# Patient Record
Sex: Male | Born: 1965 | Marital: Married | Smoking: Never smoker
Health system: Southern US, Community
[De-identification: ages and names within clinical notes are randomized; demographics above are authoritative.]

## PROBLEM LIST (undated history)

## (undated) DIAGNOSIS — B019 Varicella without complication: Secondary | ICD-10-CM

## (undated) DIAGNOSIS — I10 Essential (primary) hypertension: Secondary | ICD-10-CM

## (undated) DIAGNOSIS — E785 Hyperlipidemia, unspecified: Secondary | ICD-10-CM

## (undated) DIAGNOSIS — J45909 Unspecified asthma, uncomplicated: Secondary | ICD-10-CM

## (undated) HISTORY — DX: Unspecified asthma, uncomplicated: J45.909

## (undated) HISTORY — DX: Essential (primary) hypertension: I10

## (undated) HISTORY — PX: NO PAST SURGERIES: SHX2092

## (undated) HISTORY — DX: Varicella without complication: B01.9

## (undated) HISTORY — DX: Hyperlipidemia, unspecified: E78.5

---

## 2016-06-15 ENCOUNTER — Encounter (INDEPENDENT_AMBULATORY_CARE_PROVIDER_SITE_OTHER): Payer: Self-pay

## 2016-06-15 ENCOUNTER — Ambulatory Visit (INDEPENDENT_AMBULATORY_CARE_PROVIDER_SITE_OTHER): Payer: 59 | Admitting: Family Medicine

## 2016-06-15 ENCOUNTER — Encounter: Payer: Self-pay | Admitting: Family Medicine

## 2016-06-15 DIAGNOSIS — I10 Essential (primary) hypertension: Secondary | ICD-10-CM

## 2016-06-15 NOTE — Assessment & Plan Note (Signed)
New problem, new diagnosis. Discussed lifestyle/dietary changes/weight loss vs. starting medication. Patient elected for a trial of dietary lifestyle changes. We'll follow-up in 4-6 weeks.

## 2016-06-15 NOTE — Progress Notes (Signed)
   Subjective:  Patient ID: Terrence Yang, male    DOB: [birth year]  Age: 50 y.o. MRN: [id number]  CC: Elevated BP  HPI Terrence Yang is a 50 y.o. male presents to the clinic today as a new patient with the above complaint.  Elevated BP  Patient recently had a physical at work.  His blood pressure was found to be elevated in the 140s over 90s. It was repeated and remained elevated.  His blood pressure readings has slowly been increasing over the past few years.  His weight has also increased.  He is not currently on any medication.  He was instructed to follow-up/establish a primary.  No associated symptoms regarding his elevated blood pressure - no chest pain, shortness of breath. Of note, he has had recent changes in his vision but this has resolved.  Patient endorses regular exercise.  No known exacerbating or relieving factors.  No other complaints at this time.  PMH, Surgical Hx, Family Hx, Social History reviewed and updated as below.  Past Medical History:  Diagnosis Date  . Asthma   . Chicken pox   . Hyperlipidemia   . Hypertension    Past Surgical History:  Procedure Laterality Date  . NO PAST SURGERIES     Family History  Problem Relation Age of Onset  . Sudden death Brother   . Heart disease Brother   . Hypertension Brother   . Heart disease Maternal Grandmother    Social History  Substance Use Topics  . Smoking status: Never Smoker  . Smokeless tobacco: Never Used  . Alcohol use Yes   Review of Systems  Cardiovascular:       Elevated BP.  All other systems reviewed and are negative.  Objective:   Today's Vitals: BP (!) 136/92 (BP Location: Left Arm, Patient Position: Sitting, Cuff Size: Large)   Pulse (!) 59   Temp 98.3 F (36.8 C) (Oral)   Ht 5\' 10"  (1.778 m) Comment: with shoes  Wt 221 lb 6 oz (100.4 kg)   SpO2 99%   BMI 31.76 kg/m   Physical Exam  Constitutional: He is oriented to person, place, and time. He appears well-developed  and well-nourished. No distress.  HENT:  Head: Normocephalic and atraumatic.  Nose: Nose normal.  Mouth/Throat: Oropharynx is clear and moist. No oropharyngeal exudate.  Normal TM's bilaterally.   Eyes: Conjunctivae are normal. No scleral icterus.  Neck: Neck supple. No thyromegaly present.  Cardiovascular: Normal rate and regular rhythm.   No murmur heard. Pulmonary/Chest: Effort normal and breath sounds normal. He has no wheezes. He has no rales.  Abdominal: Soft. He exhibits no distension. There is no tenderness. There is no rebound and no guarding.  Musculoskeletal: Normal range of motion. He exhibits no edema.  Lymphadenopathy:    He has no cervical adenopathy.  Neurological: He is alert and oriented to person, place, and time.  Skin: Skin is warm and dry. No rash noted.  Psychiatric: He has a normal mood and affect.  Vitals reviewed.  Assessment & Plan:   Problem List Items Addressed This Visit    Essential hypertension    New problem, new diagnosis. Discussed lifestyle/dietary changes/weight loss vs. starting medication. Patient elected for a trial of dietary lifestyle changes. We'll follow-up in 4-6 weeks.       Other Visit Diagnoses   None.    Follow-up: Return in about 5 weeks (around 07/20/2016).  Everlene OtherJayce Emalia Witkop DO [facility] [place]

## 2016-06-15 NOTE — Patient Instructions (Signed)
Watch your diet and try and exercise.  I'll see you back in 4-6 weeks.  Take care  Dr. Adriana Simasook   Health Maintenance, Male A healthy lifestyle and preventative care can promote health and wellness.  Maintain regular health, dental, and eye exams.  Eat a healthy diet. Foods like vegetables, fruits, whole grains, low-fat dairy products, and lean protein foods contain the nutrients you need and are low in calories. Decrease your intake of foods high in solid fats, added sugars, and salt. Get information about a proper diet from your health care provider, if necessary.  Regular physical exercise is one of the most important things you can do for your health. Most adults should get at least 150 minutes of moderate-intensity exercise (any activity that increases your heart rate and causes you to sweat) each week. In addition, most adults need muscle-strengthening exercises on 2 or more days a week.   Maintain a healthy weight. The body mass index (BMI) is a screening tool to identify possible weight problems. It provides an estimate of body fat based on height and weight. Your health care provider can find your BMI and can help you achieve or maintain a healthy weight. For males 20 years and older:  A BMI below 18.5 is considered underweight.  A BMI of 18.5 to 24.9 is normal.  A BMI of 25 to 29.9 is considered overweight.  A BMI of 30 and above is considered obese.  Maintain normal blood lipids and cholesterol by exercising and minimizing your intake of saturated fat. Eat a balanced diet with plenty of fruits and vegetables. Blood tests for lipids and cholesterol should begin at age 48 and be repeated every 5 years. If your lipid or cholesterol levels are high, you are over age 48, or you are at high risk for heart disease, you may need your cholesterol levels checked more frequently.Ongoing high lipid and cholesterol levels should be treated with medicines if diet and exercise are not  working.  If you smoke, find out from your health care provider how to quit. If you do not use tobacco, do not start.  Lung cancer screening is recommended for adults aged 55-80 years who are at high risk for developing lung cancer because of a history of smoking. A yearly low-dose CT scan of the lungs is recommended for people who have at least a 30-pack-year history of smoking and are current smokers or have quit within the past 15 years. A pack year of smoking is smoking an average of 1 pack of cigarettes a day for 1 year (for example, a 30-pack-year history of smoking could mean smoking 1 pack a day for 30 years or 2 packs a day for 15 years). Yearly screening should continue until the smoker has stopped smoking for at least 15 years. Yearly screening should be stopped for people who develop a health problem that would prevent them from having lung cancer treatment.  If you choose to drink alcohol, do not have more than 2 drinks per day. One drink is considered to be 12 oz (360 mL) of beer, 5 oz (150 mL) of wine, or 1.5 oz (45 mL) of liquor.  Avoid the use of street drugs. Do not share needles with anyone. Ask for help if you need support or instructions about stopping the use of drugs.  High blood pressure causes heart disease and increases the risk of stroke. High blood pressure is more likely to develop in:  People who have blood pressure in  the end of the normal range (100-139/85-89 mm Hg).  People who are overweight or obese.  People who are African American.  If you are 61-92 years of age, have your blood pressure checked every 3-5 years. If you are 102 years of age or older, have your blood pressure checked every year. You should have your blood pressure measured twice--once when you are at a hospital or clinic, and once when you are not at a hospital or clinic. Record the average of the two measurements. To check your blood pressure when you are not at a hospital or clinic, you can  use:  An automated blood pressure machine at a pharmacy.  A home blood pressure monitor.  If you are 81-32 years old, ask your health care provider if you should take aspirin to prevent heart disease.  Diabetes screening involves taking a blood sample to check your fasting blood sugar level. This should be done once every 3 years after age 66 if you are at a normal weight and without risk factors for diabetes. Testing should be considered at a younger age or be carried out more frequently if you are overweight and have at least 1 risk factor for diabetes.  Colorectal cancer can be detected and often prevented. Most routine colorectal cancer screening begins at the age of 17 and continues through age 51. However, your health care provider may recommend screening at an earlier age if you have risk factors for colon cancer. On a yearly basis, your health care provider may provide home test kits to check for hidden blood in the stool. A small camera at the end of a tube may be used to directly examine the colon (sigmoidoscopy or colonoscopy) to detect the earliest forms of colorectal cancer. Talk to your health care provider about this at age 61 when routine screening begins. A direct exam of the colon should be repeated every 5-10 years through age 43, unless early forms of precancerous polyps or small growths are found.  People who are at an increased risk for hepatitis B should be screened for this virus. You are considered at high risk for hepatitis B if:  You were born in a country where hepatitis B occurs often. Talk with your health care provider about which countries are considered high risk.  Your parents were born in a high-risk country and you have not received a shot to protect against hepatitis B (hepatitis B vaccine).  You have HIV or AIDS.  You use needles to inject street drugs.  You live with, or have sex with, someone who has hepatitis B.  You are a man who has sex with other  men (MSM).  You get hemodialysis treatment.  You take certain medicines for conditions like cancer, organ transplantation, and autoimmune conditions.  Hepatitis C blood testing is recommended for all people born from [birth year] through 1963 and any individual with known risk factors for hepatitis C.  Healthy men should no longer receive prostate-specific antigen (PSA) blood tests as part of routine cancer screening. Talk to your health care provider about prostate cancer screening.  Testicular cancer screening is not recommended for adolescents or adult males who have no symptoms. Screening includes self-exam, a health care provider exam, and other screening tests. Consult with your health care provider about any symptoms you have or any concerns you have about testicular cancer.  Practice safe sex. Use condoms and avoid high-risk sexual practices to reduce the spread of sexually transmitted infections (STIs).  You  should be screened for STIs, including gonorrhea and chlamydia if:  You are sexually active and are younger than 24 years.  You are older than 24 years, and your health care provider tells you that you are at risk for this type of infection.  Your sexual activity has changed since you were last screened, and you are at an increased risk for chlamydia or gonorrhea. Ask your health care provider if you are at risk.  If you are at risk of being infected with HIV, it is recommended that you take a prescription medicine daily to prevent HIV infection. This is called pre-exposure prophylaxis (PrEP). You are considered at risk if:  You are a man who has sex with other men (MSM).  You are a heterosexual man who is sexually active with multiple partners.  You take drugs by injection.  You are sexually active with a partner who has HIV.  Talk with your health care provider about whether you are at high risk of being infected with HIV. If you choose to begin PrEP, you should first be tested  for HIV. You should then be tested every 3 months for as long as you are taking PrEP.  Use sunscreen. Apply sunscreen liberally and repeatedly throughout the day. You should seek shade when your shadow is shorter than you. Protect yourself by wearing long sleeves, pants, a wide-brimmed hat, and sunglasses year round whenever you are outdoors.  Tell your health care provider of new moles or changes in moles, especially if there is a change in shape or color. Also, tell your health care provider if a mole is larger than the size of a pencil eraser.  A one-time screening for abdominal aortic aneurysm (AAA) and surgical repair of large AAAs by ultrasound is recommended for men aged 49-75 years who are current or former smokers.  Stay current with your vaccines (immunizations).   This information is not intended to replace advice given to you by your health care provider. Make sure you discuss any questions you have with your health care provider.   Document Released: 04/21/2008 Document Revised: 11/14/2014 Document Reviewed: 03/21/2011 Elsevier Interactive Patient Education [employer].

## 2016-06-15 NOTE — Progress Notes (Signed)
Pre visit review using our clinic review tool, if applicable. No additional management support is needed unless otherwise documented below in the visit note. 

## 2016-07-22 ENCOUNTER — Ambulatory Visit: Payer: 59 | Admitting: Family Medicine

## 2021-01-21 ENCOUNTER — Encounter: Payer: Self-pay | Admitting: Family Medicine

## 2021-01-21 ENCOUNTER — Ambulatory Visit (INDEPENDENT_AMBULATORY_CARE_PROVIDER_SITE_OTHER): Payer: 59 | Admitting: Family Medicine

## 2021-01-21 ENCOUNTER — Other Ambulatory Visit: Payer: Self-pay

## 2021-01-21 VITALS — BP 157/90 | HR 59 | Ht 70.0 in | Wt 227.4 lb

## 2021-01-21 DIAGNOSIS — I10 Essential (primary) hypertension: Secondary | ICD-10-CM

## 2021-01-21 DIAGNOSIS — Z7689 Persons encountering health services in other specified circumstances: Secondary | ICD-10-CM | POA: Insufficient documentation

## 2021-01-21 MED ORDER — LISINOPRIL 10 MG PO TABS: 10.0000 mg | ORAL_TABLET | Freq: Every day | ORAL | 3 refills | Status: DC

## 2021-01-21 NOTE — Assessment & Plan Note (Signed)
Patient has had multiple episodes of htn in the past but has always tried to control with diet but he is ready to try medication. Manual BP confirms 158/96.   Plan- Start Lisinopril, f/u 1 month PE and labs.

## 2021-01-21 NOTE — Assessment & Plan Note (Signed)
Patient in to establish care, he has not had a pcp in a while. His only active complaint is his BP. No CP or SOB.

## 2021-01-21 NOTE — Progress Notes (Signed)
Established Patient Office Visit  SUBJECTIVE:  Subjective  Patient ID: Terrence Yang, male    DOB: 11/14/1965  Age: 55 y.o. MRN: [id number]  CC:  Chief Complaint  Patient presents with  . New Patient (Initial Visit)    Patient has complaints of low heart rate and frequent headaches    HPI Terrence Yang is a 55 y.o. male presenting today for     Past Medical History:  Diagnosis Date  . Asthma   . Chicken pox   . Hyperlipidemia   . Hypertension     Past Surgical History:  Procedure Laterality Date  . NO PAST SURGERIES      Family History  Problem Relation Age of Onset  . Sudden death Brother   . Heart disease Brother   . Hypertension Brother   . Heart disease Maternal Grandmother     Social History   Socioeconomic History  . Marital status: Married    Spouse name: Not on file  . Number of children: Not on file  . Years of education: Not on file  . Highest education level: Not on file  Occupational History  . Not on file  Tobacco Use  . Smoking status: Never Smoker  . Smokeless tobacco: Never Used  Substance and Sexual Activity  . Alcohol use: Yes  . Drug use: No  . Sexual activity: Yes  Other Topics Concern  . Not on file  Social History Narrative  . Not on file   Social Determinants of Health   Financial Resource Strain: Not on file  Food Insecurity: Not on file  Transportation Needs: Not on file  Physical Activity: Not on file  Stress: Not on file  Social Connections: Not on file  Intimate Partner Violence: Not on file     Current Outpatient Medications:  .  lisinopril (ZESTRIL) 10 MG tablet, Take 1 tablet (10 mg total) by mouth daily., Disp: 90 tablet, Rfl: 3   No Known Allergies  ROS Review of Systems  Constitutional: Negative.   HENT: Negative.   Respiratory: Negative.   Cardiovascular: Negative.   Genitourinary: Negative.   Musculoskeletal: Negative.   Psychiatric/Behavioral: Negative.      OBJECTIVE:    Physical  Exam Vitals reviewed.  Constitutional:      Appearance: Normal appearance.  HENT:     Right Ear: Tympanic membrane normal.     Mouth/Throat:     Mouth: Mucous membranes are dry.  Cardiovascular:     Rate and Rhythm: Normal rate and regular rhythm.  Pulmonary:     Effort: Pulmonary effort is normal.  Musculoskeletal:        General: Normal range of motion.  Skin:    General: Skin is warm.  Neurological:     Mental Status: He is alert.  Psychiatric:        Mood and Affect: Mood normal.     BP (!) 157/90   Pulse (!) 59   Ht 5' 10"  (1.778 m)   Wt 227 lb 6.4 oz (103.1 kg)   BMI 32.63 kg/m  Wt Readings from Last 3 Encounters:  01/21/21 227 lb 6.4 oz (103.1 kg)  06/15/16 221 lb 6 oz (100.4 kg)    Health Maintenance Due  Topic Date Due  . Hepatitis C Screening  Never done  . COVID-19 Vaccine (1) Never done  . HIV Screening  Never done  . COLONOSCOPY (Pts 45-36yr Insurance coverage will need to be confirmed)  Never done  . INFLUENZA VACCINE  Never done  . TETANUS/TDAP  12/06/2020    There are no preventive care reminders to display for this patient.  No flowsheet data found. No flowsheet data found.  No results found for: TSH No results found for: ALBUMIN, ANIONGAP, EGFR, GFR No results found for: CHOL, HDL, LDLCALC, CHOLHDL No results found for: TRIG No results found for: HGBA1C    ASSESSMENT & PLAN:   Problem List Items Addressed This Visit      Cardiovascular and Mediastinum   Essential hypertension - Primary    Patient has had multiple episodes of htn in the past but has always tried to control with diet but he is ready to try medication. Manual BP confirms 158/96.   Plan- Start Lisinopril, f/u 1 month PE and labs.       Relevant Medications   lisinopril (ZESTRIL) 10 MG tablet   Other Relevant Orders   EKG 12-Lead     Other   Encounter to establish care    Patient in to establish care, he has not had a pcp in a while. His only active complaint is  his BP. No CP or SOB.       Relevant Orders   CBC with Differential/Platelet   COMPLETE METABOLIC PANEL WITH GFR   PSA   TSH   Hemoglobin A1c      Meds ordered this encounter  Medications  . lisinopril (ZESTRIL) 10 MG tablet    Sig: Take 1 tablet (10 mg total) by mouth daily.    Dispense:  90 tablet    Refill:  3      Follow-up: No follow-ups on file.    Beckie Salts, [place] [address] [place], [place] [address]

## 2021-01-22 LAB — CBC WITH DIFFERENTIAL/PLATELET
Absolute Monocytes: 602 cells/uL (ref 200–950)
Basophils Absolute: 89 cells/uL (ref 0–200)
Basophils Relative: 1.5 %
Eosinophils Absolute: 118 cells/uL (ref 15–500)
Eosinophils Relative: 2 %
HCT: 53.7 % — ABNORMAL HIGH (ref 38.5–50.0)
Hemoglobin: 17.8 g/dL — ABNORMAL HIGH (ref 13.2–17.1)
Lymphs Abs: 2000 cells/uL (ref 850–3900)
MCH: 28.6 pg (ref 27.0–33.0)
MCHC: 33.1 g/dL (ref 32.0–36.0)
MCV: 86.3 fL (ref 80.0–100.0)
MPV: 10.3 fL (ref 7.5–12.5)
Monocytes Relative: 10.2 %
Neutro Abs: 3092 cells/uL (ref 1500–7800)
Neutrophils Relative %: 52.4 %
Platelets: 250 10*3/uL (ref 140–400)
RBC: 6.22 10*6/uL — ABNORMAL HIGH (ref 4.20–5.80)
RDW: 12.7 % (ref 11.0–15.0)
Total Lymphocyte: 33.9 %
WBC: 5.9 10*3/uL (ref 3.8–10.8)

## 2021-01-22 LAB — COMPLETE METABOLIC PANEL WITH GFR
AG Ratio: 1.8 (calc) (ref 1.0–2.5)
ALT: 47 U/L — ABNORMAL HIGH (ref 9–46)
AST: 27 U/L (ref 10–35)
Albumin: 4.8 g/dL (ref 3.6–5.1)
Alkaline phosphatase (APISO): 65 U/L (ref 35–144)
BUN: 16 mg/dL (ref 7–25)
CO2: 26 mmol/L (ref 20–32)
Calcium: 9.9 mg/dL (ref 8.6–10.3)
Chloride: 102 mmol/L (ref 98–110)
Creat: 0.99 mg/dL (ref 0.70–1.33)
GFR, Est African American: 101 mL/min/{1.73_m2} (ref >=60)
GFR, Est Non African American: 87 mL/min/{1.73_m2} (ref >=60)
Globulin: 2.7 g/dL (calc) (ref 1.9–3.7)
Glucose, Bld: 88 mg/dL (ref 65–99)
Potassium: 4.5 mmol/L (ref 3.5–5.3)
Sodium: 138 mmol/L (ref 135–146)
Total Bilirubin: 2.2 mg/dL — ABNORMAL HIGH (ref 0.2–1.2)
Total Protein: 7.5 g/dL (ref 6.1–8.1)

## 2021-01-22 LAB — HEMOGLOBIN A1C
Hgb A1c MFr Bld: 4.9 % of total Hgb (ref <5.7)
Mean Plasma Glucose: 94 mg/dL
eAG (mmol/L): 5.2 mmol/L

## 2021-01-22 LAB — PSA: PSA: 0.39 ng/mL (ref <=4.0)

## 2021-01-22 LAB — TSH: TSH: 0.7 mIU/L (ref 0.40–4.50)

## 2021-02-25 ENCOUNTER — Ambulatory Visit (INDEPENDENT_AMBULATORY_CARE_PROVIDER_SITE_OTHER): Payer: 59 | Admitting: Internal Medicine

## 2021-02-25 ENCOUNTER — Encounter: Payer: Self-pay | Admitting: Internal Medicine

## 2021-02-25 ENCOUNTER — Other Ambulatory Visit: Payer: Self-pay

## 2021-02-25 VITALS — BP 146/72 | HR 82 | Ht 70.0 in | Wt 215.9 lb

## 2021-02-25 DIAGNOSIS — I10 Essential (primary) hypertension: Secondary | ICD-10-CM | POA: Diagnosis not present

## 2021-02-25 DIAGNOSIS — Z0000 Encounter for general adult medical examination without abnormal findings: Secondary | ICD-10-CM | POA: Diagnosis not present

## 2021-02-25 NOTE — Assessment & Plan Note (Signed)

## 2021-02-25 NOTE — Progress Notes (Signed)
Established Patient Office Visit  Subjective:  Patient ID: Terrence Yang, male    DOB: 20-Jan-1966  Age: 55 y.o. MRN: [id number]  CC:  Chief Complaint  Patient presents with  . Annual Exam    HPI  Terrence Yang presents for  Physical, patient denies any chest pain shortness of breath.  His blood tests were reviewed they were found to be normal except for elevated hemoglobin.  Patient was advised to Give blood twice a year.  He does not have any prostate trouble there is no history of diabetes 0r asthma he is taking blood pressure medication.  Past Medical History:  Diagnosis Date  . Asthma   . Chicken pox   . Hyperlipidemia   . Hypertension     Past Surgical History:  Procedure Laterality Date  . NO PAST SURGERIES      Family History  Problem Relation Age of Onset  . Sudden death Brother   . Heart disease Brother   . Hypertension Brother   . Heart disease Maternal Grandmother     Social History   Socioeconomic History  . Marital status: Married    Spouse name: Not on file  . Number of children: Not on file  . Years of education: Not on file  . Highest education level: Not on file  Occupational History  . Not on file  Tobacco Use  . Smoking status: Never Smoker  . Smokeless tobacco: Never Used  Substance and Sexual Activity  . Alcohol use: Yes  . Drug use: No  . Sexual activity: Yes  Other Topics Concern  . Not on file  Social History Narrative  . Not on file   Social Determinants of Health   Financial Resource Strain: Not on file  Food Insecurity: Not on file  Transportation Needs: Not on file  Physical Activity: Not on file  Stress: Not on file  Social Connections: Not on file  Intimate Partner Violence: Not on file     Current Outpatient Medications:  .  lisinopril (ZESTRIL) 10 MG tablet, Take 1 tablet (10 mg total) by mouth daily., Disp: 90 tablet, Rfl: 3   No Known Allergies  ROS Review of Systems  Constitutional: Negative.   HENT:  Negative.   Eyes: Negative.   Respiratory: Negative.   Cardiovascular: Negative.   Gastrointestinal: Negative.   Endocrine: Negative.   Genitourinary: Negative.   Musculoskeletal: Negative.   Skin: Negative.   Allergic/Immunologic: Negative.   Neurological: Negative.   Hematological: Negative.   Psychiatric/Behavioral: Negative.   All other systems reviewed and are negative.     Objective:    Physical Exam Vitals reviewed.  Constitutional:      Appearance: Normal appearance.  HENT:     Mouth/Throat:     Mouth: Mucous membranes are moist.  Eyes:     Pupils: Pupils are equal, round, and reactive to light.  Neck:     Vascular: No carotid bruit.  Cardiovascular:     Rate and Rhythm: Normal rate and regular rhythm.     Pulses: Normal pulses.     Heart sounds: Normal heart sounds.  Pulmonary:     Effort: Pulmonary effort is normal.     Breath sounds: Normal breath sounds.  Abdominal:     General: Bowel sounds are normal.     Palpations: Abdomen is soft. There is no hepatomegaly, splenomegaly or mass.     Tenderness: There is no abdominal tenderness.     Hernia: No hernia is present.  Genitourinary:    Comments: Rectal examination revealed normal sphincter tone.  Prostate normal size.  Male genitalia normal.  Stool negative for blood. Musculoskeletal:     Cervical back: Neck supple.     Right lower leg: No edema.     Left lower leg: No edema.  Skin:    Findings: No rash.  Neurological:     Mental Status: He is alert and oriented to person, place, and time.     Motor: No weakness.  Psychiatric:        Mood and Affect: Mood normal.        Behavior: Behavior normal.     BP (!) 146/72   Pulse 82   Ht 5\' 10"  (1.778 m)   Wt 215 lb 14.4 oz (97.9 kg)   BMI 30.98 kg/m  Wt Readings from Last 3 Encounters:  02/25/21 215 lb 14.4 oz (97.9 kg)  01/21/21 227 lb 6.4 oz (103.1 kg)  06/15/16 221 lb 6 oz (100.4 kg)     Health Maintenance Due  Topic Date Due  .  Hepatitis C Screening  Never done  . COVID-19 Vaccine (1) Never done  . HIV Screening  Never done  . COLONOSCOPY (Pts 45-52yrs Insurance coverage will need to be confirmed)  Never done  . TETANUS/TDAP  12/06/2020    There are no preventive care reminders to display for this patient.  Lab Results  Component Value Date   TSH 0.70 01/21/2021   Lab Results  Component Value Date   WBC 5.9 01/21/2021   HGB 17.8 (H) 01/21/2021   HCT 53.7 (H) 01/21/2021   MCV 86.3 01/21/2021   PLT 250 01/21/2021   Lab Results  Component Value Date   NA 138 01/21/2021   K 4.5 01/21/2021   CO2 26 01/21/2021   GLUCOSE 88 01/21/2021   BUN 16 01/21/2021   CREATININE 0.99 01/21/2021   BILITOT 2.2 (H) 01/21/2021   AST 27 01/21/2021   ALT 47 (H) 01/21/2021   PROT 7.5 01/21/2021   CALCIUM 9.9 01/21/2021   No results found for: CHOL No results found for: HDL No results found for: LDLCALC No results found for: TRIG No results found for: CHOLHDL Lab Results  Component Value Date   HGBA1C 4.9 01/21/2021      Assessment & Plan:   Problem List Items Addressed This Visit   None     No orders of the defined types were placed in this encounter.   Follow-up: No follow-ups on file.    01/23/2021, MD

## 2021-04-21 ENCOUNTER — Encounter: Payer: Self-pay | Admitting: Family Medicine

## 2021-04-23 ENCOUNTER — Ambulatory Visit (INDEPENDENT_AMBULATORY_CARE_PROVIDER_SITE_OTHER): Payer: 59 | Admitting: Family Medicine

## 2021-04-23 ENCOUNTER — Encounter: Payer: Self-pay | Admitting: Family Medicine

## 2021-04-23 ENCOUNTER — Other Ambulatory Visit: Payer: Self-pay

## 2021-04-23 VITALS — BP 131/76 | HR 58 | Ht 70.0 in | Wt 206.7 lb

## 2021-04-23 DIAGNOSIS — R001 Bradycardia, unspecified: Secondary | ICD-10-CM

## 2021-04-23 NOTE — Assessment & Plan Note (Signed)
Patient in today with Bradycardia during sleep mostly. The HR has been down to 35 in sleep and was captured with his apple watch. No symptoms when he wakes from the watch alarm. Denies SOB or CP. His Grandfather did have a pacemaker for low HR, ECG shows 60 today.   Plan- Patient will return and have 24 hour.

## 2021-04-23 NOTE — Progress Notes (Signed)
Established Patient Office Visit  SUBJECTIVE:  Subjective  Patient ID: Terrence Yang, male    DOB: 02/10/1966  Age: 55 y.o. MRN: [id number]  CC:  Chief Complaint  Patient presents with   Bradycardia    Patient complains of bradycardia at night. Happening once every hour from 1am to 6:30 am. Patient states this has been reoccurring since March.    HPI Terrence Yang is a 55 y.o. male presenting today for     Past Medical History:  Diagnosis Date   Asthma    Chicken pox    Hyperlipidemia    Hypertension     Past Surgical History:  Procedure Laterality Date   NO PAST SURGERIES      Family History  Problem Relation Age of Onset   Sudden death Brother    Heart disease Brother    Hypertension Brother    Heart disease Maternal Grandmother     Social History   Socioeconomic History   Marital status: Married    Spouse name: Not on file   Number of children: Not on file   Years of education: Not on file   Highest education level: Not on file  Occupational History   Not on file  Tobacco Use   Smoking status: Never   Smokeless tobacco: Never  Substance and Sexual Activity   Alcohol use: Yes   Drug use: No   Sexual activity: Yes  Other Topics Concern   Not on file  Social History Narrative   Not on file   Social Determinants of Health   Financial Resource Strain: Not on file  Food Insecurity: Not on file  Transportation Needs: Not on file  Physical Activity: Not on file  Stress: Not on file  Social Connections: Not on file  Intimate Partner Violence: Not on file     Current Outpatient Medications:    lisinopril (ZESTRIL) 10 MG tablet, Take 1 tablet (10 mg total) by mouth daily., Disp: 90 tablet, Rfl: 3   No Known Allergies  ROS Review of Systems  Constitutional: Negative.   HENT: Negative.    Respiratory: Negative.    Cardiovascular: Negative.   Gastrointestinal: Negative.   Neurological: Negative.   Psychiatric/Behavioral: Negative.       OBJECTIVE:    Physical Exam HENT:     Head: Normocephalic.  Cardiovascular:     Rate and Rhythm: Normal rate and regular rhythm.  Chest:  Breasts:    Right: Normal.     Left: Normal.  Musculoskeletal:        General: Normal range of motion.  Skin:    General: Skin is warm.  Neurological:     Mental Status: He is alert.    BP 131/76   Pulse (!) 58   Ht 5' 10"  (1.778 m)   Wt 206 lb 11.2 oz (93.8 kg)   BMI 29.66 kg/m  Wt Readings from Last 3 Encounters:  04/23/21 206 lb 11.2 oz (93.8 kg)  02/25/21 215 lb 14.4 oz (97.9 kg)  01/21/21 227 lb 6.4 oz (103.1 kg)    Health Maintenance Due  Topic Date Due   HIV Screening  Never done   Hepatitis C Screening  Never done   COLONOSCOPY (Pts 45-73yr Insurance coverage will need to be confirmed)  Never done   Zoster Vaccines- Shingrix (1 of 2) Never done   TETANUS/TDAP  12/06/2020    There are no preventive care reminders to display for this patient.  CBC Latest Ref Rng & Units  01/21/2021  WBC 3.8 - 10.8 Thousand/uL 5.9  Hemoglobin 13.2 - 17.1 g/dL 17.8(H)  Hematocrit 38.5 - 50.0 % 53.7(H)  Platelets 140 - 400 Thousand/uL 250   CMP Latest Ref Rng & Units 01/21/2021  Glucose 65 - 99 mg/dL 88  BUN 7 - 25 mg/dL 16  Creatinine 0.70 - 1.33 mg/dL 0.99  Sodium 135 - 146 mmol/L 138  Potassium 3.5 - 5.3 mmol/L 4.5  Chloride 98 - 110 mmol/L 102  CO2 20 - 32 mmol/L 26  Calcium 8.6 - 10.3 mg/dL 9.9  Total Protein 6.1 - 8.1 g/dL 7.5  Total Bilirubin 0.2 - 1.2 mg/dL 2.2(H)  AST 10 - 35 U/L 27  ALT 9 - 46 U/L 47(H)    Lab Results  Component Value Date   TSH 0.70 01/21/2021   No results found for: ALBUMIN, ANIONGAP, EGFR, GFR No results found for: CHOL, HDL, LDLCALC, CHOLHDL No results found for: TRIG Lab Results  Component Value Date   HGBA1C 4.9 01/21/2021      ASSESSMENT & PLAN:   Problem List Items Addressed This Visit       Other   Bradycardia - Primary    Patient in today with Bradycardia during sleep mostly.  The HR has been down to 35 in sleep and was captured with his apple watch. No symptoms when he wakes from the watch alarm. Denies SOB or CP. His Grandfather did have a pacemaker for low HR, ECG shows 60 today.   Plan- Patient will return and have 24 hour.        Relevant Orders   EKG 12-Lead    No orders of the defined types were placed in this encounter.     Follow-up: No follow-ups on file.    Beckie Salts, [employer] [address] [place],  [address]

## 2021-04-27 ENCOUNTER — Ambulatory Visit: Payer: 59

## 2021-04-27 ENCOUNTER — Other Ambulatory Visit: Payer: Self-pay

## 2021-05-04 ENCOUNTER — Encounter: Payer: Self-pay | Admitting: Internal Medicine

## 2021-05-04 ENCOUNTER — Ambulatory Visit (INDEPENDENT_AMBULATORY_CARE_PROVIDER_SITE_OTHER): Payer: 59 | Admitting: Internal Medicine

## 2021-05-04 ENCOUNTER — Other Ambulatory Visit: Payer: Self-pay

## 2021-05-04 VITALS — BP 129/73 | HR 54 | Ht 70.0 in | Wt 206.8 lb

## 2021-05-04 DIAGNOSIS — I10 Essential (primary) hypertension: Secondary | ICD-10-CM | POA: Diagnosis not present

## 2021-05-04 DIAGNOSIS — R001 Bradycardia, unspecified: Secondary | ICD-10-CM | POA: Diagnosis not present

## 2021-05-04 NOTE — Progress Notes (Signed)
Established Patient Office Visit  Subjective:  Patient ID: Terrence Yang, male    DOB: 1966-03-28  Age: 55 y.o. MRN: [id number]  CC:  Chief Complaint  Patient presents with   Holter Results    HPI  Terrence Yang presents for evaluation of bradycardia.  Patient denies any chest pain there is no history of dizziness or passing out spell.  No history of seizure activity.  No history of heart attack.  Holter monitor also revealed episode of bradycardia with a rate dropping to 40, he has a smart watch for which she calculated oximetry as well as heart rate.  There is no episode of vasovagal episode or seizure disorder.  He does not smoke does not drink.  Past Medical History:  Diagnosis Date   Asthma    Chicken pox    Hyperlipidemia    Hypertension     Past Surgical History:  Procedure Laterality Date   NO PAST SURGERIES      Family History  Problem Relation Age of Onset   Sudden death Brother    Heart disease Brother    Hypertension Brother    Heart disease Maternal Grandmother     Social History   Socioeconomic History   Marital status: Married    Spouse name: Not on file   Number of children: Not on file   Years of education: Not on file   Highest education level: Not on file  Occupational History   Not on file  Tobacco Use   Smoking status: Never   Smokeless tobacco: Never  Substance and Sexual Activity   Alcohol use: Yes   Drug use: No   Sexual activity: Yes  Other Topics Concern   Not on file  Social History Narrative   Not on file   Social Determinants of Health   Financial Resource Strain: Not on file  Food Insecurity: Not on file  Transportation Needs: Not on file  Physical Activity: Not on file  Stress: Not on file  Social Connections: Not on file  Intimate Partner Violence: Not on file     Current Outpatient Medications:    lisinopril (ZESTRIL) 10 MG tablet, Take 1 tablet (10 mg total) by mouth daily., Disp: 90 tablet, Rfl: 3   No Known  Allergies  ROS Review of Systems  Constitutional: Negative.   HENT: Negative.    Eyes: Negative.   Respiratory: Negative.    Cardiovascular: Negative.   Gastrointestinal: Negative.   Endocrine: Negative.   Genitourinary: Negative.   Musculoskeletal: Negative.   Skin: Negative.   Allergic/Immunologic: Negative.   Neurological: Negative.   Hematological: Negative.   Psychiatric/Behavioral: Negative.    All other systems reviewed and are negative.    Objective:    Physical Exam Vitals reviewed.  Constitutional:      Appearance: Normal appearance.  HENT:     Mouth/Throat:     Mouth: Mucous membranes are moist.  Eyes:     Pupils: Pupils are equal, round, and reactive to light.  Neck:     Vascular: No carotid bruit.  Cardiovascular:     Rate and Rhythm: Normal rate and regular rhythm.     Pulses: Normal pulses.     Heart sounds: Normal heart sounds.  Pulmonary:     Effort: Pulmonary effort is normal.     Breath sounds: Normal breath sounds.  Abdominal:     General: Bowel sounds are normal.     Palpations: Abdomen is soft. There is no hepatomegaly, splenomegaly or  mass.     Tenderness: There is no abdominal tenderness.     Hernia: No hernia is present.  Musculoskeletal:     Cervical back: Neck supple.     Right lower leg: No edema.     Left lower leg: No edema.  Skin:    Findings: No rash.  Neurological:     Mental Status: He is alert and oriented to person, place, and time.     Motor: No weakness.  Psychiatric:        Mood and Affect: Mood normal.        Behavior: Behavior normal.    BP 129/73   Pulse (!) 54   Ht 5\' 10"  (1.778 m)   Wt 206 lb 12.8 oz (93.8 kg)   BMI 29.67 kg/m  Wt Readings from Last 3 Encounters:  05/04/21 206 lb 12.8 oz (93.8 kg)  04/23/21 206 lb 11.2 oz (93.8 kg)  02/25/21 215 lb 14.4 oz (97.9 kg)     Health Maintenance Due  Topic Date Due   HIV Screening  Never done   Hepatitis C Screening  Never done   COLONOSCOPY (Pts  45-41yrs Insurance coverage will need to be confirmed)  Never done   Zoster Vaccines- Shingrix (1 of 2) Never done   TETANUS/TDAP  12/06/2020  Report of the Holter monitor. Holter monitor revealed evidence of sinus bradycardia no long pauses were noted.  Sinus tachycardia was noted.  There are no preventive care reminders to display for this patient.  Lab Results  Component Value Date   TSH 0.70 01/21/2021   Lab Results  Component Value Date   WBC 5.9 01/21/2021   HGB 17.8 (H) 01/21/2021   HCT 53.7 (H) 01/21/2021   MCV 86.3 01/21/2021   PLT 250 01/21/2021   Lab Results  Component Value Date   NA 138 01/21/2021   K 4.5 01/21/2021   CO2 26 01/21/2021   GLUCOSE 88 01/21/2021   BUN 16 01/21/2021   CREATININE 0.99 01/21/2021   BILITOT 2.2 (H) 01/21/2021   AST 27 01/21/2021   ALT 47 (H) 01/21/2021   PROT 7.5 01/21/2021   CALCIUM 9.9 01/21/2021   No results found for: CHOL No results found for: HDL No results found for: LDLCALC No results found for: TRIG No results found for: CHOLHDL Lab Results  Component Value Date   HGBA1C 4.9 01/21/2021      Assessment & Plan:   Problem List Items Addressed This Visit       Cardiovascular and Mediastinum   Essential hypertension - Primary     Patient denies any chest pain or shortness of breath there is no history of palpitation or paroxysmal nocturnal dyspnea   patient was advised to follow low-salt low-cholesterol diet    ideally I want to keep systolic blood pressure below 01/23/2021 mmHg, patient was asked to check blood pressure one times a week and give me a report on that.  Patient will be follow-up in 3 months  or earlier as needed, patient will call me back for any change in the cardiovascular symptoms Patient was advised to buy a book from local bookstore concerning blood pressure and read several chapters  every day.  This will be supplemented by some of the material we will give him from the office.  Patient should also  utilize other resources like YouTube and Internet to learn more about the blood pressure and the diet.         Other  Bradycardia    Patient does not have any history of postural changes in the heart rate. Carotid pressure also did not produce any bradycardia We will schedule him for an echocardiogram and also a stress test to further evaluate any organic heart disease.        No orders of the defined types were placed in this encounter.   Follow-up: No follow-ups on file.    Corky Downs, MD

## 2021-05-04 NOTE — Assessment & Plan Note (Signed)

## 2021-05-04 NOTE — Assessment & Plan Note (Signed)
Patient does not have any history of postural changes in the heart rate. Carotid pressure also did not produce any bradycardia We will schedule him for an echocardiogram and also a stress test to further evaluate any organic heart disease.

## 2021-05-04 NOTE — Addendum Note (Signed)
Addended by: Jobie Quaker on: 05/04/2021 02:10 PM   Modules accepted: Orders

## 2021-05-06 ENCOUNTER — Ambulatory Visit
Admission: RE | Admit: 2021-05-06 | Discharge: 2021-05-06 | Disposition: A | Discharge status: Home / Self Care | Payer: 59 | Attending: Internal Medicine | Admitting: Internal Medicine

## 2021-05-06 ENCOUNTER — Ambulatory Visit
Admission: RE | Admit: 2021-05-06 | Discharge: 2021-05-06 | Disposition: A | Discharge status: Home / Self Care | Payer: 59 | Source: Ambulatory Visit | Attending: Internal Medicine | Admitting: Internal Medicine

## 2021-05-06 DIAGNOSIS — R001 Bradycardia, unspecified: Secondary | ICD-10-CM | POA: Insufficient documentation

## 2021-05-24 ENCOUNTER — Other Ambulatory Visit: Payer: 59

## 2021-05-27 ENCOUNTER — Other Ambulatory Visit (INDEPENDENT_AMBULATORY_CARE_PROVIDER_SITE_OTHER): Payer: 59

## 2021-05-27 ENCOUNTER — Other Ambulatory Visit: Payer: Self-pay

## 2021-05-27 DIAGNOSIS — R001 Bradycardia, unspecified: Secondary | ICD-10-CM

## 2021-05-27 DIAGNOSIS — I10 Essential (primary) hypertension: Secondary | ICD-10-CM | POA: Diagnosis not present

## 2021-05-27 NOTE — Assessment & Plan Note (Signed)
Echocardiogram was performed to evaluate the sinus bradycardia.  Chest x-ray is unremarkable without any cardiomegaly

## 2021-05-27 NOTE — Assessment & Plan Note (Signed)

## 2021-05-27 NOTE — Progress Notes (Unsigned)
Established Patient Office Visit  Subjective:  Patient ID: Terrence Yang, male    DOB: 1966-06-11  Age: 55 y.o. MRN: [id number]  CC: No chief complaint on file.   HPI  Terrence Yang presents for echo He is known to have sinus bradycardia, there is no history of syncope. Past Medical History:  Diagnosis Date   Asthma    Chicken pox    Hyperlipidemia    Hypertension     Past Surgical History:  Procedure Laterality Date   NO PAST SURGERIES      Family History  Problem Relation Age of Onset   Sudden death Brother    Heart disease Brother    Hypertension Brother    Heart disease Maternal Grandmother     Social History   Socioeconomic History   Marital status: Married    Spouse name: Not on file   Number of children: Not on file   Years of education: Not on file   Highest education level: Not on file  Occupational History   Not on file  Tobacco Use   Smoking status: Never   Smokeless tobacco: Never  Substance and Sexual Activity   Alcohol use: Yes   Drug use: No   Sexual activity: Yes  Other Topics Concern   Not on file  Social History Narrative   Not on file   Social Determinants of Health   Financial Resource Strain: Not on file  Food Insecurity: Not on file  Transportation Needs: Not on file  Physical Activity: Not on file  Stress: Not on file  Social Connections: Not on file  Intimate Partner Violence: Not on file     Current Outpatient Medications:    lisinopril (ZESTRIL) 10 MG tablet, Take 1 tablet (10 mg total) by mouth daily., Disp: 90 tablet, Rfl: 3   No Known Allergies  ROS Review of Systems  Constitutional: Negative.   HENT: Negative.    Eyes: Negative.   Respiratory: Negative.    Cardiovascular: Negative.   Gastrointestinal: Negative.   Endocrine: Negative.   Genitourinary: Negative.   Musculoskeletal: Negative.   Skin: Negative.   Allergic/Immunologic: Negative.   Neurological: Negative.   Hematological: Negative.    Psychiatric/Behavioral: Negative.    All other systems reviewed and are negative.    Objective:    Physical Exam Vitals reviewed.  Constitutional:      Appearance: Normal appearance.  HENT:     Mouth/Throat:     Mouth: Mucous membranes are moist.  Eyes:     Pupils: Pupils are equal, round, and reactive to light.  Neck:     Vascular: No carotid bruit.  Cardiovascular:     Rate and Rhythm: Normal rate and regular rhythm.     Pulses: Normal pulses.     Heart sounds: Normal heart sounds.  Pulmonary:     Effort: Pulmonary effort is normal.     Breath sounds: Normal breath sounds.  Abdominal:     General: Bowel sounds are normal.     Palpations: Abdomen is soft. There is no hepatomegaly, splenomegaly or mass.     Tenderness: There is no abdominal tenderness.     Hernia: No hernia is present.  Musculoskeletal:     Cervical back: Neck supple.     Right lower leg: No edema.     Left lower leg: No edema.  Skin:    Findings: No rash.  Neurological:     Mental Status: He is alert and oriented to person, place, and  time.     Motor: No weakness.  Psychiatric:        Mood and Affect: Mood normal.        Behavior: Behavior normal.    There were no vitals taken for this visit. Wt Readings from Last 3 Encounters:  05/04/21 206 lb 12.8 oz (93.8 kg)  04/23/21 206 lb 11.2 oz (93.8 kg)  02/25/21 215 lb 14.4 oz (97.9 kg)     Health Maintenance Due  Topic Date Due   HIV Screening  Never done   Hepatitis C Screening  Never done   COLONOSCOPY (Pts 45-17yrs Insurance coverage will need to be confirmed)  Never done   Zoster Vaccines- Shingrix (1 of 2) Never done   TETANUS/TDAP  12/06/2020   COVID-19 Vaccine (2 - Booster for Janssen series) 01/18/2021    There are no preventive care reminders to display for this patient.  Lab Results  Component Value Date   TSH 0.70 01/21/2021   Lab Results  Component Value Date   WBC 5.9 01/21/2021   HGB 17.8 (H) 01/21/2021   HCT 53.7 (H)  01/21/2021   MCV 86.3 01/21/2021   PLT 250 01/21/2021   Lab Results  Component Value Date   NA 138 01/21/2021   K 4.5 01/21/2021   CO2 26 01/21/2021   GLUCOSE 88 01/21/2021   BUN 16 01/21/2021   CREATININE 0.99 01/21/2021   BILITOT 2.2 (H) 01/21/2021   AST 27 01/21/2021   ALT 47 (H) 01/21/2021   PROT 7.5 01/21/2021   CALCIUM 9.9 01/21/2021   No results found for: CHOL No results found for: HDL No results found for: LDLCALC No results found for: TRIG No results found for: [facility] Lab Results  Component Value Date   HGBA1C 4.9 01/21/2021      Assessment & Plan:    [facility] [address] [place], [place] [address] Phone: [phone] Fax:  [phone]  Transthoracic Echocardiogram Note  Terrence Yang [id number] June 28, 1966  Procedure: Transthoracic Echocardiogram Indications: Sinus bradycardia Verbal Consent: Obtained  Procedure Details   Technical quality: good  Resting Measurements: Within normal limits  Left Ventrical: Size is normal  Mitral Valve: Anterior and posterior mitral leaflets are within normal limits there is a mildly prolapse of the posterior mitral valve leaflets on two-dimensional echocardiogram.  Aortic Valve: Aortic valve is normal tricuspid  Tricuspid Valve: Tricuspid valve is normal.  Pulmonic Valve: Pulmonary valve not seen.  Left Atrium/ Left atrial appendage: Left atrial size is within normal limits.  Atrial septum:   Aorta: Aortic root is normal there is no aneurysm.   Complications: No apparent complications Patient did tolerate procedure well.  Corky Downs, MD       Problem List Items Addressed This Visit   None   No orders of the defined types were placed in this encounter.   Follow-up: No follow-ups on file.    Corky Downs, MD

## 2021-06-01 ENCOUNTER — Other Ambulatory Visit: Payer: Self-pay | Admitting: Internal Medicine

## 2021-06-01 ENCOUNTER — Other Ambulatory Visit: Payer: Self-pay

## 2021-06-01 ENCOUNTER — Other Ambulatory Visit (INDEPENDENT_AMBULATORY_CARE_PROVIDER_SITE_OTHER): Payer: 59

## 2021-06-01 DIAGNOSIS — R001 Bradycardia, unspecified: Secondary | ICD-10-CM | POA: Diagnosis not present

## 2021-06-01 DIAGNOSIS — I10 Essential (primary) hypertension: Secondary | ICD-10-CM

## 2021-06-01 NOTE — Progress Notes (Unsigned)
Established Patient Office Visit  Subjective:  Patient ID: Terrence Yang, male    DOB: [birth year]  Age: 55 y.o. MRN: [id number]  CC: No chief complaint on file.   HPI  Terrence Yang presents for stress test He is known to have sinus bradycardia, there is no history of syncope. Past Medical History:  Diagnosis Date   Asthma    Chicken pox    Hyperlipidemia    Hypertension     Past Surgical History:  Procedure Laterality Date   NO PAST SURGERIES      Family History  Problem Relation Age of Onset   Sudden death Brother    Heart disease Brother    Hypertension Brother    Heart disease Maternal Grandmother     Social History   Socioeconomic History   Marital status: Married    Spouse name: Not on file   Number of children: Not on file   Years of education: Not on file   Highest education level: Not on file  Occupational History   Not on file  Tobacco Use   Smoking status: Never   Smokeless tobacco: Never  Substance and Sexual Activity   Alcohol use: Yes   Drug use: No   Sexual activity: Yes  Other Topics Concern   Not on file  Social History Narrative   Not on file   Social Determinants of Health   Financial Resource Strain: Not on file  Food Insecurity: Not on file  Transportation Needs: Not on file  Physical Activity: Not on file  Stress: Not on file  Social Connections: Not on file  Intimate Partner Violence: Not on file     Current Outpatient Medications:    lisinopril (ZESTRIL) 10 MG tablet, Take 1 tablet (10 mg total) by mouth daily., Disp: 90 tablet, Rfl: 3   No Known Allergies  ROS Review of Systems  Constitutional: Negative.   HENT: Negative.    Eyes: Negative.   Respiratory: Negative.    Cardiovascular: Negative.   Gastrointestinal: Negative.   Endocrine: Negative.   Genitourinary: Negative.   Musculoskeletal: Negative.   Skin: Negative.   Allergic/Immunologic: Negative.   Neurological: Negative.   Hematological: Negative.    Psychiatric/Behavioral: Negative.    All other systems reviewed and are negative.    Objective:    Physical Exam Vitals reviewed.  Constitutional:      Appearance: Normal appearance.  HENT:     Mouth/Throat:     Mouth: Mucous membranes are moist.  Eyes:     Pupils: Pupils are equal, round, and reactive to light.  Neck:     Vascular: No carotid bruit.  Cardiovascular:     Rate and Rhythm: Normal rate and regular rhythm.     Pulses: Normal pulses.     Heart sounds: Normal heart sounds.  Pulmonary:     Effort: Pulmonary effort is normal.     Breath sounds: Normal breath sounds.  Abdominal:     General: Bowel sounds are normal.     Palpations: Abdomen is soft. There is no hepatomegaly, splenomegaly or mass.     Tenderness: There is no abdominal tenderness.     Hernia: No hernia is present.  Musculoskeletal:     Cervical back: Neck supple.     Right lower leg: No edema.     Left lower leg: No edema.  Skin:    Findings: No rash.  Neurological:     Mental Status: He is alert and oriented to person, place,  and time.     Motor: No weakness.  Psychiatric:        Mood and Affect: Mood normal.        Behavior: Behavior normal.    There were no vitals taken for this visit. Wt Readings from Last 3 Encounters:  05/04/21 206 lb 12.8 oz (93.8 kg)  04/23/21 206 lb 11.2 oz (93.8 kg)  02/25/21 215 lb 14.4 oz (97.9 kg)     Health Maintenance Due  Topic Date Due   HIV Screening  Never done   Hepatitis C Screening  Never done   COLONOSCOPY (Pts 45-70yrs Insurance coverage will need to be confirmed)  Never done   Zoster Vaccines- Shingrix (1 of 2) Never done   TETANUS/TDAP  12/06/2020   COVID-19 Vaccine (2 - Booster for Janssen series) 01/18/2021    There are no preventive care reminders to display for this patient.  Lab Results  Component Value Date   TSH 0.70 01/21/2021   Lab Results  Component Value Date   WBC 5.9 01/21/2021   HGB 17.8 (H) 01/21/2021   HCT 53.7 (H)  01/21/2021   MCV 86.3 01/21/2021   PLT 250 01/21/2021   Lab Results  Component Value Date   NA 138 01/21/2021   K 4.5 01/21/2021   CO2 26 01/21/2021   GLUCOSE 88 01/21/2021   BUN 16 01/21/2021   CREATININE 0.99 01/21/2021   BILITOT 2.2 (H) 01/21/2021   AST 27 01/21/2021   ALT 47 (H) 01/21/2021   PROT 7.5 01/21/2021   CALCIUM 9.9 01/21/2021   No results found for: CHOL No results found for: HDL No results found for: LDLCALC No results found for: TRIG No results found for: [facility] Lab Results  Component Value Date   HGBA1C 4.9 01/21/2021      Assessment & Plan:     PATIENT ID: Terrence Yang, male     DOB: [birth year], 55 y.o.     MRN: [id number]   Procedure Note Stress Test  Reason for Study: Stress test   Study:  Treadmill Stress Test  Pre-test ECG:  normal; normal EKG, normal sinus rhythm  Level of Stress:  85% age-predicted max HR  METS achieved 6  Functional Capacity:  better than average  Abnormal Symptoms:  none  Heart Rate Response:  normal  BP Response:   normal  Stress ECG:  no ECG changes; normal EKG, normal sinus rhythm, unchanged from previous tracings, normal sinus rhythm.   Impression:   Normal test.   The patient was exercised according to the Bruce protocol.  Exercise test was stopped at the end of 13 min due to mild shortness of breath  he did not have any chest pain he did not have any ST changes or myocardial ischemia  He did not have any arrhythmia during or after the stress test.  Maximum heart rate was 115 bpm.   Maximum blood pressure 160/80.   Exercise test was considered negative for angina.   No arrhythmia was noted.  There is no  claudication     Interpreted by: Corky Downs, MD 06/01/21 5:02 PM     Electronically Signed: Corky Downs, MD 06/01/21, 5:02 PM          Problem List Items Addressed This Visit   None   No orders of the defined types were placed in this encounter.   Follow-up: No follow-ups on file.     Corky Downs, MD

## 2021-06-01 NOTE — Assessment & Plan Note (Signed)
Blood pressure stable ? ?

## 2021-06-01 NOTE — Assessment & Plan Note (Signed)
Stress test was performed to evaluate bradycardia

## 2021-07-01 ENCOUNTER — Ambulatory Visit: Payer: 59 | Admitting: Family Medicine

## 2021-08-17 ENCOUNTER — Other Ambulatory Visit: Payer: Self-pay

## 2021-08-17 ENCOUNTER — Ambulatory Visit (INDEPENDENT_AMBULATORY_CARE_PROVIDER_SITE_OTHER): Payer: 59 | Admitting: Internal Medicine

## 2021-08-17 ENCOUNTER — Encounter: Payer: Self-pay | Admitting: Internal Medicine

## 2021-08-17 VITALS — BP 119/71 | HR 48 | Ht 70.0 in | Wt 210.5 lb

## 2021-08-17 DIAGNOSIS — I10 Essential (primary) hypertension: Secondary | ICD-10-CM | POA: Diagnosis not present

## 2021-08-17 DIAGNOSIS — R001 Bradycardia, unspecified: Secondary | ICD-10-CM | POA: Diagnosis not present

## 2021-08-17 NOTE — Assessment & Plan Note (Signed)
He watches his heart rate through iPhone.

## 2021-08-17 NOTE — Assessment & Plan Note (Signed)
Patient denies any chest pain or shortness of breath there is no history of palpitation or paroxysmal nocturnal dyspnea   patient was advised to follow low-salt low-cholesterol diet    ideally I want to keep systolic blood pressure below 161 mmHg, patient was asked to check blood pressure one times a week and give me a report on that.  Patient will be follow-up in 3 months  or earlier as needed, patient will call me back for any change in the cardiovascular symptoms Patient was advised to buy a book from local bookstore concerning blood pressure and read several chapters  every day.  This will be supplemented by some of the material we will give him from the office.  Patient should also utilize other resources like YouTube and Internet to learn more about the blood pressure and the diet.  Patient has bradycardia heart rate is 42 today.

## 2021-08-17 NOTE — Progress Notes (Signed)
Established Patient Office Visit  Subjective:  Patient ID: Terrence Yang, male    DOB: [birth year]  Age: 55 y.o. MRN: [id number]  CC:  Chief Complaint  Patient presents with   Hypertension    3 month BP follow up    Hypertension   Terrence Yang presents for bp check  Past Medical History:  Diagnosis Date   Asthma    Chicken pox    Hyperlipidemia    Hypertension     Past Surgical History:  Procedure Laterality Date   NO PAST SURGERIES      Family History  Problem Relation Age of Onset   Sudden death Brother    Heart disease Brother    Hypertension Brother    Heart disease Maternal Grandmother     Social History   Socioeconomic History   Marital status: Married    Spouse name: Not on file   Number of children: Not on file   Years of education: Not on file   Highest education level: Not on file  Occupational History   Not on file  Tobacco Use   Smoking status: Never   Smokeless tobacco: Never  Substance and Sexual Activity   Alcohol use: Yes   Drug use: No   Sexual activity: Yes  Other Topics Concern   Not on file  Social History Narrative   Not on file   Social Determinants of Health   Financial Resource Strain: Not on file  Food Insecurity: Not on file  Transportation Needs: Not on file  Physical Activity: Not on file  Stress: Not on file  Social Connections: Not on file  Intimate Partner Violence: Not on file     Current Outpatient Medications:    lisinopril (ZESTRIL) 10 MG tablet, Take 1 tablet (10 mg total) by mouth daily., Disp: 90 tablet, Rfl: 3   No Known Allergies  ROS Review of Systems  Constitutional: Negative.   HENT: Negative.    Eyes: Negative.   Respiratory: Negative.    Cardiovascular: Negative.   Gastrointestinal: Negative.   Endocrine: Negative.   Genitourinary: Negative.   Musculoskeletal: Negative.   Skin: Negative.   Allergic/Immunologic: Negative.   Neurological: Negative.   Hematological: Negative.    Psychiatric/Behavioral: Negative.    All other systems reviewed and are negative.    Objective:    Physical Exam Vitals reviewed.  Constitutional:      Appearance: Normal appearance.  HENT:     Mouth/Throat:     Mouth: Mucous membranes are moist.  Eyes:     Pupils: Pupils are equal, round, and reactive to light.  Neck:     Vascular: No carotid bruit.  Cardiovascular:     Rate and Rhythm: Normal rate and regular rhythm.     Pulses: Normal pulses.     Heart sounds: Normal heart sounds.  Pulmonary:     Effort: Pulmonary effort is normal.     Breath sounds: Normal breath sounds.  Abdominal:     General: Bowel sounds are normal.     Palpations: Abdomen is soft. There is no hepatomegaly, splenomegaly or mass.     Tenderness: There is no abdominal tenderness.     Hernia: No hernia is present.  Musculoskeletal:     Cervical back: Neck supple.     Right lower leg: No edema.     Left lower leg: No edema.  Skin:    Findings: No rash.  Neurological:     Mental Status: He is alert and oriented  to person, place, and time.     Motor: No weakness.  Psychiatric:        Mood and Affect: Mood normal.        Behavior: Behavior normal.    BP 119/71   Pulse (!) 48   Ht 5\' 10"  (1.778 m)   Wt 210 lb 8 oz (95.5 kg)   BMI 30.20 kg/m  Wt Readings from Last 3 Encounters:  08/17/21 210 lb 8 oz (95.5 kg)  05/04/21 206 lb 12.8 oz (93.8 kg)  04/23/21 206 lb 11.2 oz (93.8 kg)     Health Maintenance Due  Topic Date Due   HIV Screening  Never done   Hepatitis C Screening  Never done   Zoster Vaccines- Shingrix (1 of 2) Never done   COVID-19 Vaccine (2 - Booster for Janssen series) 01/18/2021    There are no preventive care reminders to display for this patient.  Lab Results  Component Value Date   TSH 0.70 01/21/2021   Lab Results  Component Value Date   WBC 5.9 01/21/2021   HGB 17.8 (H) 01/21/2021   HCT 53.7 (H) 01/21/2021   MCV 86.3 01/21/2021   PLT 250 01/21/2021   Lab  Results  Component Value Date   NA 138 01/21/2021   K 4.5 01/21/2021   CO2 26 01/21/2021   GLUCOSE 88 01/21/2021   BUN 16 01/21/2021   CREATININE 0.99 01/21/2021   BILITOT 2.2 (H) 01/21/2021   AST 27 01/21/2021   ALT 47 (H) 01/21/2021   PROT 7.5 01/21/2021   CALCIUM 9.9 01/21/2021   No results found for: CHOL No results found for: HDL No results found for: LDLCALC No results found for: TRIG No results found for: CHOLHDL Lab Results  Component Value Date   HGBA1C 4.9 01/21/2021      Assessment & Plan:   Problem List Items Addressed This Visit       Cardiovascular and Mediastinum   Essential hypertension - Primary     Patient denies any chest pain or shortness of breath there is no history of palpitation or paroxysmal nocturnal dyspnea   patient was advised to follow low-salt low-cholesterol diet    ideally I want to keep systolic blood pressure below 01/23/2021 mmHg, patient was asked to check blood pressure one times a week and give me a report on that.  Patient will be follow-up in 3 months  or earlier as needed, patient will call me back for any change in the cardiovascular symptoms Patient was advised to buy a book from local bookstore concerning blood pressure and read several chapters  every day.  This will be supplemented by some of the material we will give him from the office.  Patient should also utilize other resources like YouTube and Internet to learn more about the blood pressure and the diet.  Patient has bradycardia heart rate is 42 today.         Other   Bradycardia    He watches his heart rate through iPhone.       No orders of the defined types were placed in this encounter.   Follow-up: No follow-ups on file.    462, MD

## 2022-01-06 ENCOUNTER — Other Ambulatory Visit: Payer: Self-pay | Admitting: *Deleted

## 2022-01-06 MED ORDER — LISINOPRIL 10 MG PO TABS: 10.0000 mg | ORAL_TABLET | Freq: Every day | ORAL | 3 refills | Status: DC

## 2022-01-18 ENCOUNTER — Other Ambulatory Visit: Payer: Self-pay

## 2022-01-18 ENCOUNTER — Encounter: Payer: Self-pay | Admitting: Internal Medicine

## 2022-01-18 ENCOUNTER — Ambulatory Visit (INDEPENDENT_AMBULATORY_CARE_PROVIDER_SITE_OTHER): Payer: 59 | Admitting: Internal Medicine

## 2022-01-18 VITALS — BP 140/75 | HR 53 | Ht 70.0 in | Wt 217.0 lb

## 2022-01-18 DIAGNOSIS — I10 Essential (primary) hypertension: Secondary | ICD-10-CM

## 2022-01-18 DIAGNOSIS — E8881 Metabolic syndrome: Secondary | ICD-10-CM | POA: Diagnosis not present

## 2022-01-18 DIAGNOSIS — R001 Bradycardia, unspecified: Secondary | ICD-10-CM

## 2022-01-18 NOTE — Assessment & Plan Note (Signed)

## 2022-01-18 NOTE — Assessment & Plan Note (Signed)
Stable at the present time. 

## 2022-01-18 NOTE — Assessment & Plan Note (Signed)

## 2022-01-18 NOTE — Progress Notes (Signed)
? ?Established Patient Office Visit ? ?Subjective:  ?Patient ID: Terrence Yang, male    DOB: 10-28-1966  Age: 56 y.o. MRN: [id number] ? ?CC:  ?Chief Complaint  ?Patient presents with  ? Hypertension  ?  Patient is here for blood pressure follow up  ? ? ?Hypertension ? ? ?Terrence Yang presents for check up on bp ? ?Past Medical History:  ?Diagnosis Date  ? Asthma   ? Chicken pox   ? Hyperlipidemia   ? Hypertension   ? ? ?Past Surgical History:  ?Procedure Laterality Date  ? NO PAST SURGERIES    ? ? ?Family History  ?Problem Relation Age of Onset  ? Sudden death Brother   ? Heart disease Brother   ? Hypertension Brother   ? Heart disease Maternal Grandmother   ? ? ?Social History  ? ?Socioeconomic History  ? Marital status: Married  ?  Spouse name: Not on file  ? Number of children: Not on file  ? Years of education: Not on file  ? Highest education level: Not on file  ?Occupational History  ? Not on file  ?Tobacco Use  ? Smoking status: Never  ? Smokeless tobacco: Never  ?Substance and Sexual Activity  ? Alcohol use: Yes  ? Drug use: No  ? Sexual activity: Yes  ?Other Topics Concern  ? Not on file  ?Social History Narrative  ? Not on file  ? ?Social Determinants of Health  ? ?Financial Resource Strain: Not on file  ?Food Insecurity: Not on file  ?Transportation Needs: Not on file  ?Physical Activity: Not on file  ?Stress: Not on file  ?Social Connections: Not on file  ?Intimate Partner Violence: Not on file  ? ? ? ?Current Outpatient Medications:  ?  lisinopril (ZESTRIL) 10 MG tablet, Take 1 tablet (10 mg total) by mouth daily., Disp: 90 tablet, Rfl: 3  ? ?No Known Allergies ? ?ROS ?Review of Systems  ?Constitutional: Negative.   ?HENT: Negative.    ?Eyes: Negative.   ?Respiratory: Negative.    ?Cardiovascular: Negative.   ?Gastrointestinal: Negative.   ?Endocrine: Negative.   ?Genitourinary: Negative.   ?Musculoskeletal: Negative.   ?Skin: Negative.   ?Allergic/Immunologic: Negative.   ?Neurological: Negative.    ?Hematological: Negative.   ?Psychiatric/Behavioral: Negative.    ?All other systems reviewed and are negative. ? ?  ?Objective:  ?  ?Physical Exam ?Vitals reviewed.  ?Constitutional:   ?   Appearance: Normal appearance.  ?HENT:  ?   Mouth/Throat:  ?   Mouth: Mucous membranes are moist.  ?Eyes:  ?   Pupils: Pupils are equal, round, and reactive to light.  ?Neck:  ?   Vascular: No carotid bruit.  ?Cardiovascular:  ?   Rate and Rhythm: Normal rate and regular rhythm.  ?   Pulses: Normal pulses.  ?   Heart sounds: Normal heart sounds.  ?Pulmonary:  ?   Effort: Pulmonary effort is normal.  ?   Breath sounds: Normal breath sounds.  ?Abdominal:  ?   General: Bowel sounds are normal.  ?   Palpations: Abdomen is soft. There is no hepatomegaly, splenomegaly or mass.  ?   Tenderness: There is no abdominal tenderness.  ?   Hernia: No hernia is present.  ?Musculoskeletal:  ?   Cervical back: Neck supple.  ?   Right lower leg: No edema.  ?   Left lower leg: No edema.  ?Skin: ?   Findings: No rash.  ?Neurological:  ?   Mental Status:  He is alert and oriented to person, place, and time.  ?   Motor: No weakness.  ?Psychiatric:     ?   Mood and Affect: Mood normal.     ?   Behavior: Behavior normal.  ? ? ?BP 140/75   Pulse (!) 53   Ht 5\' 10"  (1.778 m)   Wt 217 lb (98.4 kg)   BMI 31.14 kg/m?  ?Wt Readings from Last 3 Encounters:  ?01/18/22 217 lb (98.4 kg)  ?08/17/21 210 lb 8 oz (95.5 kg)  ?05/04/21 206 lb 12.8 oz (93.8 kg)  ? ? ? ?Health Maintenance Due  ?Topic Date Due  ? HIV Screening  Never done  ? Hepatitis C Screening  Never done  ? Zoster Vaccines- Shingrix (1 of 2) Never done  ? COVID-19 Vaccine (2 - Booster for Janssen series) 01/18/2021  ? ? ?There are no preventive care reminders to display for this patient. ? ?Lab Results  ?Component Value Date  ? TSH 0.70 01/21/2021  ? ?Lab Results  ?Component Value Date  ? WBC 5.9 01/21/2021  ? HGB 17.8 (H) 01/21/2021  ? HCT 53.7 (H) 01/21/2021  ? MCV 86.3 01/21/2021  ? PLT 250  01/21/2021  ? ?Lab Results  ?Component Value Date  ? NA 138 01/21/2021  ? K 4.5 01/21/2021  ? CO2 26 01/21/2021  ? GLUCOSE 88 01/21/2021  ? BUN 16 01/21/2021  ? CREATININE 0.99 01/21/2021  ? BILITOT 2.2 (H) 01/21/2021  ? AST 27 01/21/2021  ? ALT 47 (H) 01/21/2021  ? PROT 7.5 01/21/2021  ? CALCIUM 9.9 01/21/2021  ? ?No results found for: CHOL ?No results found for: HDL ?No results found for: LDLCALC ?No results found for: TRIG ?No results found for: CHOLHDL ?Lab Results  ?Component Value Date  ? HGBA1C 4.9 01/21/2021  ? ? ?  ?Assessment & Plan:  ? ?Problem List Items Addressed This Visit   ? ?  ? Cardiovascular and Mediastinum  ? Essential hypertension - Primary  ?   Patient denies any chest pain or shortness of breath there is no history of palpitation or paroxysmal nocturnal dyspnea ?  patient was advised to follow low-salt low-cholesterol diet ? ?  ideally I want to keep systolic blood pressure below 01/23/2021 mmHg, patient was asked to check blood pressure one times a week and give me a report on that.  Patient will be follow-up in 3 months  or earlier as needed, patient will call me back for any change in the cardiovascular symptoms ?Patient was advised to buy a book from local bookstore concerning blood pressure and read several chapters  every day.  This will be supplemented by some of the material we will give him from the office.  Patient should also utilize other resources like YouTube and Internet to learn more about the blood pressure and the diet. ?  ?  ?  ? Other  ? Bradycardia  ?  Stable at the present time ?  ?  ? Metabolic syndrome  ?  - I encouraged the patient to lose weight.  ?- I educated them on making healthy dietary choices including eating more fruits and vegetables and less fried foods. ?- I encouraged the patient to exercise more, and educated on the benefits of exercise including weight loss, diabetes prevention, and hypertension prevention.  ? Dietary counseling with a registered  dietician ? Referral to a weight management support group (e.g. Weight Watchers, Overeaters Anonymous) ?? If your BMI is greater than 29  or you have gained more than 15 pounds you should work on weight loss. ?? Attend a healthy cooking class  ?  ?  ?I will order a CBC metabolic panel and TSH and PSA ? ?No orders of the defined types were placed in this encounter. ? ? ?Follow-up: No follow-ups on file.  ? ? ?Corky DownsJaved Edis Huish, MD ?

## 2023-03-05 IMAGING — CR DG CHEST 2V
1 series · 2 of 2 positions shown · non-contrast
Comparison: None.

CLINICAL DATA: Bradycardia.  Asthma.  Hypertension.

EXAM:
CHEST - 2 VIEW

[Series 1: dg chest 2 view · 0.14mm/px · 2 of 2 slices shown]
[im 1/2]
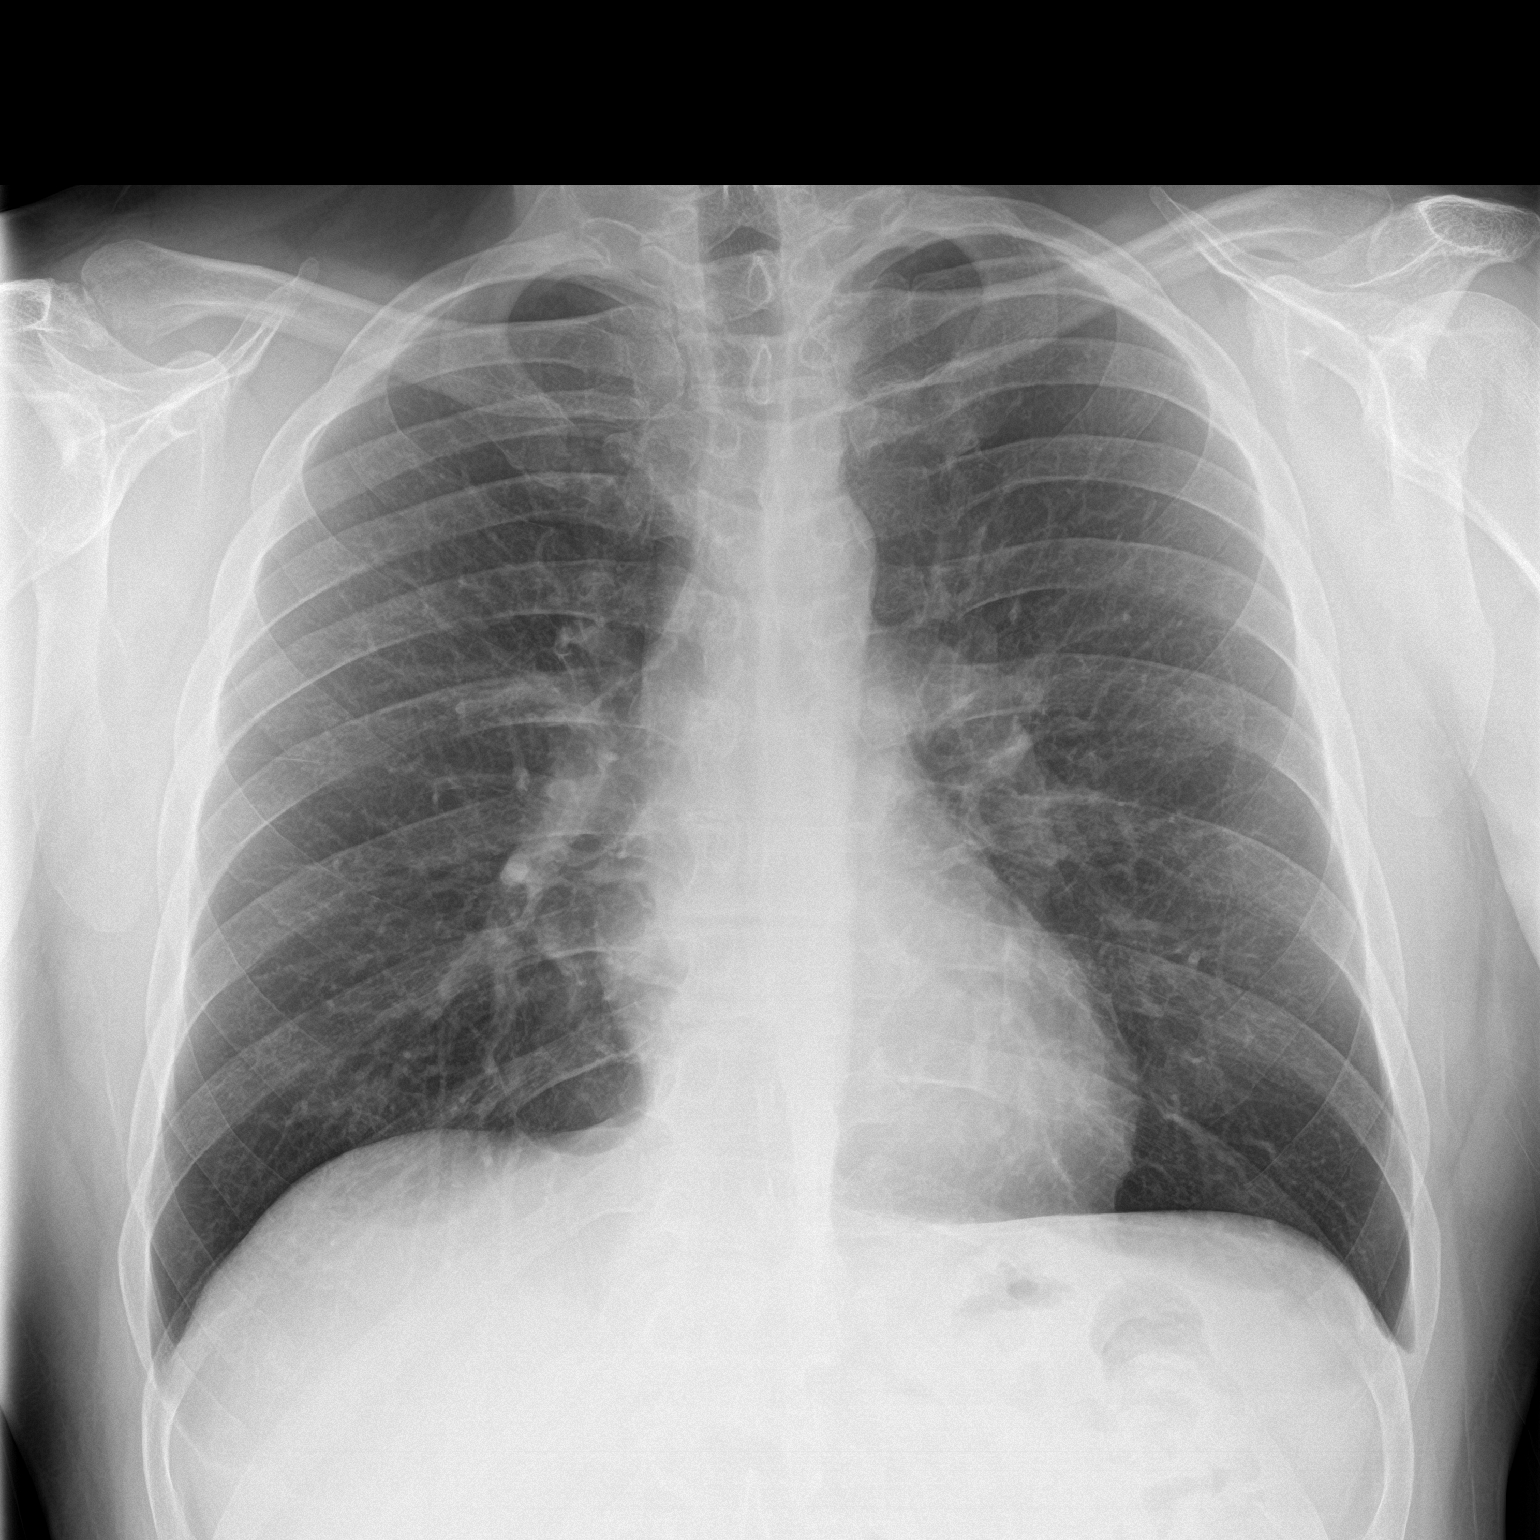
[im 2/2]
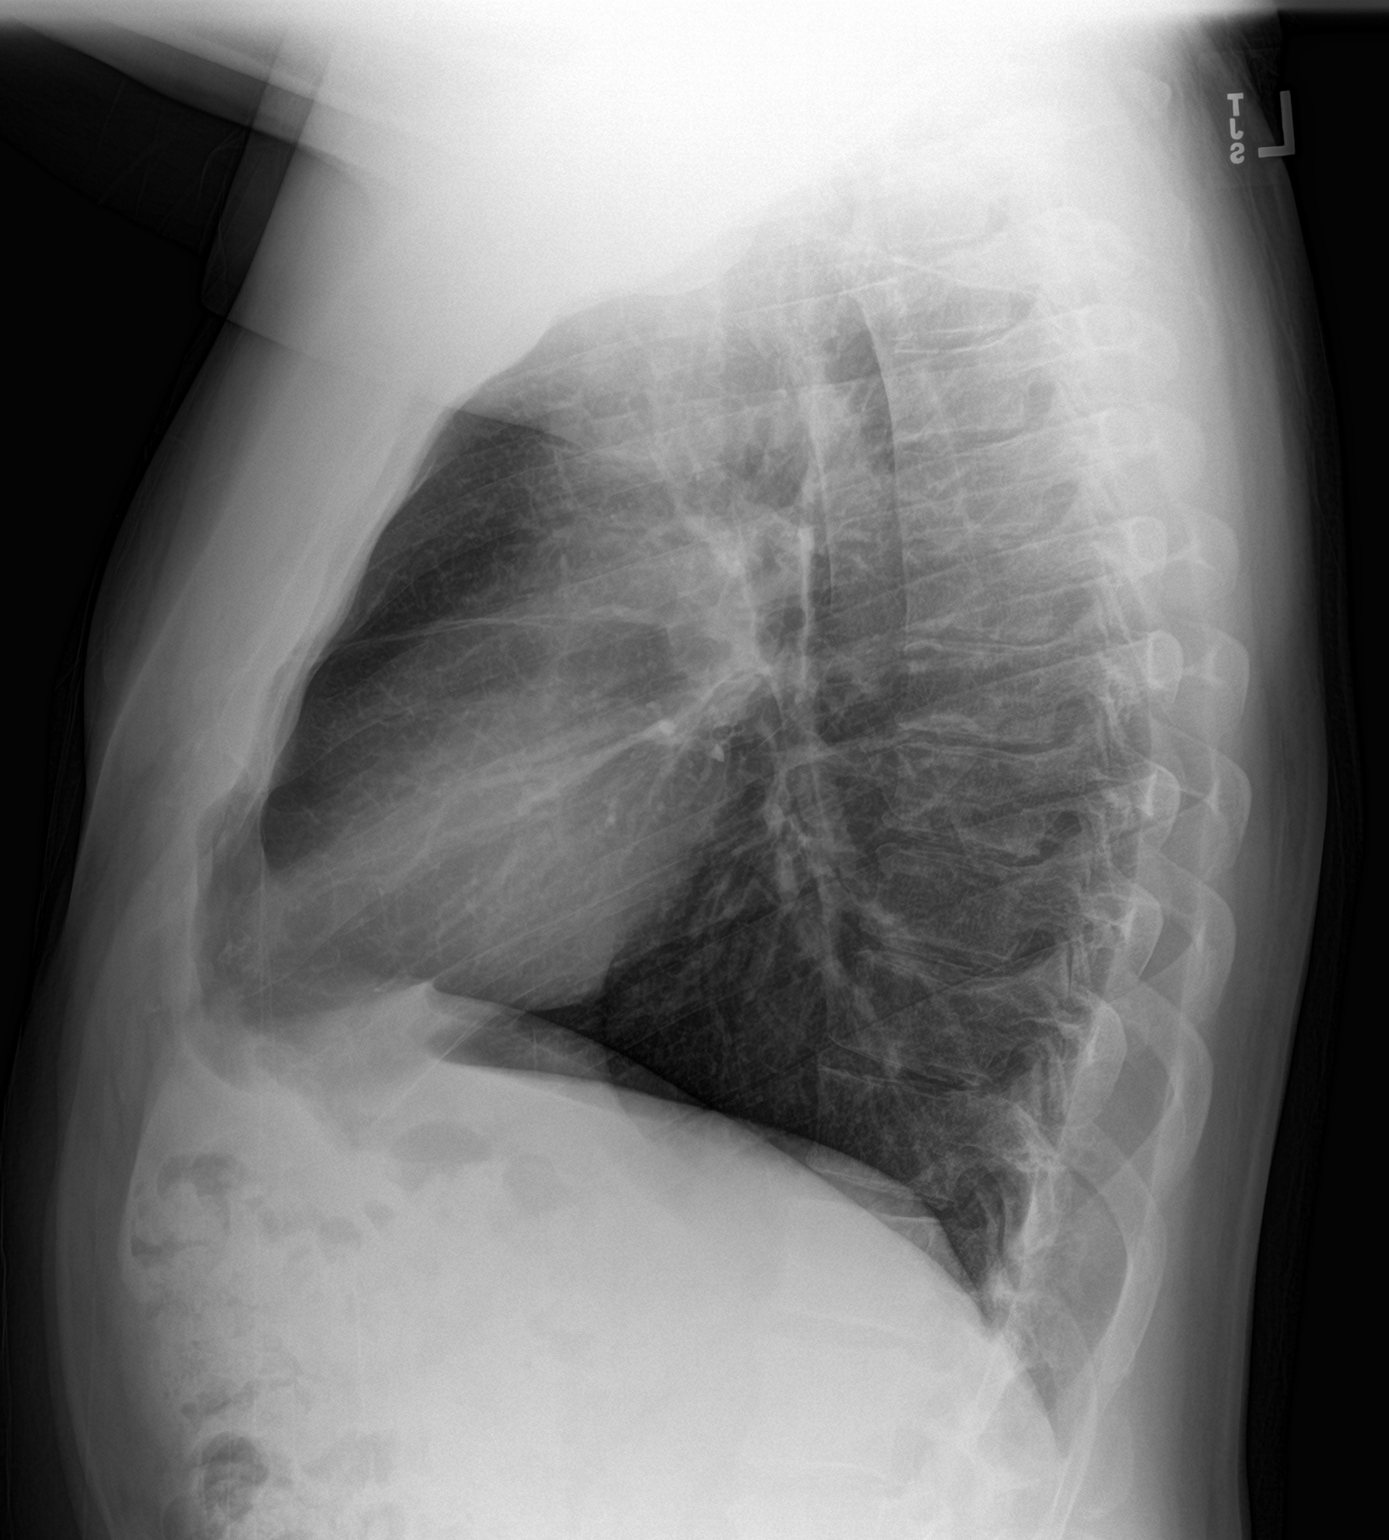

[2 of 2 positions shown; findings below may reference images not displayed]

FINDINGS: The heart size and mediastinal contours are within normal limits.
Both lungs are clear. The visualized skeletal structures are
unremarkable.
IMPRESSION: No active cardiopulmonary disease.

## 2024-01-09 ENCOUNTER — Other Ambulatory Visit: Payer: Self-pay

## 2024-04-09 ENCOUNTER — Ambulatory Visit: Admitting: Nurse Practitioner

## 2024-04-09 ENCOUNTER — Encounter: Payer: Self-pay | Admitting: Nurse Practitioner

## 2024-04-09 VITALS — BP 125/80 | HR 86 | Temp 97.9°F | Resp 16 | Ht 69.0 in | Wt 228.4 lb

## 2024-04-09 DIAGNOSIS — I10 Essential (primary) hypertension: Secondary | ICD-10-CM

## 2024-04-09 DIAGNOSIS — Z1211 Encounter for screening for malignant neoplasm of colon: Secondary | ICD-10-CM

## 2024-04-09 DIAGNOSIS — Z1212 Encounter for screening for malignant neoplasm of rectum: Secondary | ICD-10-CM

## 2024-04-09 DIAGNOSIS — M7022 Olecranon bursitis, left elbow: Secondary | ICD-10-CM

## 2024-04-09 DIAGNOSIS — E782 Mixed hyperlipidemia: Secondary | ICD-10-CM | POA: Diagnosis not present

## 2024-04-09 DIAGNOSIS — R001 Bradycardia, unspecified: Secondary | ICD-10-CM | POA: Diagnosis not present

## 2024-04-09 DIAGNOSIS — E559 Vitamin D deficiency, unspecified: Secondary | ICD-10-CM

## 2024-04-09 DIAGNOSIS — Z125 Encounter for screening for malignant neoplasm of prostate: Secondary | ICD-10-CM

## 2024-04-09 MED ORDER — LISINOPRIL 10 MG PO TABS: 10.0000 mg | ORAL_TABLET | Freq: Every day | ORAL | 3 refills | Status: AC

## 2024-04-09 NOTE — Progress Notes (Signed)
 [facility] [address] [place], [place] [address]  Internal MEDICINE  Office Visit Note  Patient Name: Terrence Yang  [id number]  [id number]  Date of Service: 04/09/2024   Complaints/HPI Pt is here for establishment of PCP. Chief Complaint  Patient presents with   New Patient (Initial Visit)   Hypertension   Elbow Pain    Swelling in elbow   Quality Metric Gaps    Colonoscopy    HPI Terrence Yang presents for a new patient visit to establish care.  Well-appearing 58 y.o. male with low resting heart rate, hypertension  Work: works at the [employer] at [employer] for [employer], works 12 hour swing shifts  Home: live at home with wife  Diet: fair, tries to limit red meat  Exercise: walking Tobacco use: never Alcohol use: occasionally, 1 beer or glass of wine with dinner, 1 shot of bourbon maybe twice a week.  Illicit drug use: none  Routine CRC screening: due now, opt for cologuard Labs: due for routine labs  New or worsening pain: none    Current Medication: Outpatient Encounter Medications as of 04/09/2024  Medication Sig   aspirin 81 MG chewable tablet Chew 81 mg by mouth daily.   [DISCONTINUED] lisinopril  (ZESTRIL ) 10 MG tablet Take 1 tablet (10 mg total) by mouth daily.   lisinopril  (ZESTRIL ) 10 MG tablet Take 1 tablet (10 mg total) by mouth daily.   No facility-administered encounter medications on file as of 04/09/2024.    Surgical History: Past Surgical History:  Procedure Laterality Date   NO PAST SURGERIES      Medical History: Past Medical History:  Diagnosis Date   Asthma    Chicken pox    Hypertension     Family History: Family History  Problem Relation Age of Onset   Sudden death Brother    Heart disease Brother    Hypertension Brother    Heart disease Maternal Grandmother     Social History   Socioeconomic History   Marital status: Married    Spouse name: Not on file   Number of children: Not on file   Years of education: Not  on file   Highest education level: Not on file  Occupational History   Not on file  Tobacco Use   Smoking status: Never   Smokeless tobacco: Never  Substance and Sexual Activity   Alcohol use: Yes   Drug use: No   Sexual activity: Yes  Other Topics Concern   Not on file  Social History Narrative   Not on file   Social Drivers of Health   Financial Resource Strain: Not on file  Food Insecurity: Not on file  Transportation Needs: Not on file  Physical Activity: Not on file  Stress: Not on file  Social Connections: Not on file  Intimate Partner Violence: Not on file     Review of Systems  Constitutional:  Negative for chills, fatigue and unexpected weight change.  HENT:  Negative for congestion, postnasal drip, rhinorrhea, sneezing and sore throat.   Eyes:  Negative for redness.  Respiratory:  Negative for cough, chest tightness and shortness of breath.   Cardiovascular:  Negative for chest pain and palpitations.  Gastrointestinal:  Negative for abdominal pain, constipation, diarrhea, nausea and vomiting.  Genitourinary:  Negative for dysuria and frequency.  Musculoskeletal:  Positive for arthralgias (left elbow pain from olecranon bursitis. too small to drain at this time.). Negative for back pain, joint swelling and neck pain.  Skin:  Negative for rash.  Neurological: Negative.  Negative for tremors and numbness.  Hematological:  Negative for adenopathy. Does not bruise/bleed easily.  Psychiatric/Behavioral:  Negative for behavioral problems (Depression), sleep disturbance and suicidal ideas. The patient is not nervous/anxious.     Vital Signs: BP 125/80 Comment: 163/98  Pulse 86   Temp 97.9 F (36.6 C)   Resp 16   Ht 5' 9 (1.753 m)   Wt 228 lb 6.4 oz (103.6 kg)   SpO2 98%   BMI 33.73 kg/m    Physical Exam Vitals reviewed.  Constitutional:      General: He is not in acute distress.    Appearance: Normal appearance. He is obese. He is not ill-appearing.   HENT:     Head: Normocephalic and atraumatic.  Eyes:     Pupils: Pupils are equal, round, and reactive to light.  Cardiovascular:     Rate and Rhythm: Regular rhythm. Bradycardia present.  Pulmonary:     Effort: Pulmonary effort is normal. No respiratory distress.  Musculoskeletal:        General: Tenderness (left elbow) present.  Neurological:     Mental Status: He is alert.       Assessment/Plan: 1. Essential hypertension (Primary) Stable, continue lisinopril  as prescribed. Routine labs ordered.  - CBC with Differential/Platelet - CMP14+EGFR - Lipid Profile - lisinopril  (ZESTRIL ) 10 MG tablet; Take 1 tablet (10 mg total) by mouth daily.  Dispense: 90 tablet; Refill: 3  2. Asymptomatic bradycardia Routine labs ordered.  - CBC with Differential/Platelet - CMP14+EGFR - Lipid Profile  3. Mixed hyperlipidemia Routine labs ordered  - CBC with Differential/Platelet - CMP14+EGFR - Lipid Profile  4. Olecranon bursitis of left elbow Information on at home care provided to patient. Encourage, rest, elevated and ice. Too small to drain at this time. No signs of worsening infection. Patient instructed on what signs to look for to call the clinic and what signs to go to the ER for.   5. Vitamin D  deficiency Routine labs ordered  - CBC with Differential/Platelet - CMP14+EGFR - Lipid Profile - Vitamin D  (25 hydroxy)  6. Screening for prostate cancer Routine lab ordered  - CBC with Differential/Platelet - PSA Total (Reflex To Free)  7. Screening for colorectal cancer Cologuard ordered  - Cologuard - CBC with Differential/Platelet    General Counseling: Terrence Yang verbalizes understanding of the findings of todays visit and agrees with plan of treatment. I have discussed any further diagnostic evaluation that may be needed or ordered today. We also reviewed his medications today. he has been encouraged to call the office with any questions or concerns that should arise  related to todays visit.    Orders Placed This Encounter  Procedures   Cologuard   CBC with Differential/Platelet   CMP14+EGFR   Lipid Profile   PSA Total (Reflex To Free)   Vitamin D  (25 hydroxy)    Meds ordered this encounter  Medications   lisinopril  (ZESTRIL ) 10 MG tablet    Sig: Take 1 tablet (10 mg total) by mouth daily.    Dispense:  90 tablet    Refill:  3    Return for CPE, Cap Massi PCP at earliest available opening, have labs done prior to visit. .  Time spent:30 Minutes Time spent with patient included reviewing progress notes, labs, imaging studies, and discussing plan for follow up.   [place] Controlled Substance Database was reviewed by me for overdose risk score (ORS)   This patient was seen by Mardy  Leasia Swann, [name] in collaboration with Dr. Sigrid Bathe as a part of collaborative care agreement.   Yanissa Michalsky R. Liana, MSN, [name] Internal Medicine

## 2024-04-17 LAB — CBC WITH DIFFERENTIAL/PLATELET
Basophils Absolute: 0.1 10*3/uL (ref 0.0–0.2)
Basos: 1 %
EOS (ABSOLUTE): 0.2 10*3/uL (ref 0.0–0.4)
Eos: 3 %
Hematocrit: 49.6 % (ref 37.5–51.0)
Hemoglobin: 16.6 g/dL (ref 13.0–17.7)
Immature Grans (Abs): 0 10*3/uL (ref 0.0–0.1)
Immature Granulocytes: 0 %
Lymphocytes Absolute: 2.4 10*3/uL (ref 0.7–3.1)
Lymphs: 35 %
MCH: 28.9 pg (ref 26.6–33.0)
MCHC: 33.5 g/dL (ref 31.5–35.7)
MCV: 86 fL (ref 79–97)
Monocytes Absolute: 0.7 10*3/uL (ref 0.1–0.9)
Monocytes: 10 %
Neutrophils Absolute: 3.5 10*3/uL (ref 1.4–7.0)
Neutrophils: 51 %
Platelets: 266 10*3/uL (ref 150–450)
RBC: 5.74 x10E6/uL (ref 4.14–5.80)
RDW: 12.9 % (ref 11.6–15.4)
WBC: 6.8 10*3/uL (ref 3.4–10.8)

## 2024-04-17 LAB — CMP14+EGFR
ALT: 80 IU/L — ABNORMAL HIGH (ref 0–44)
AST: 39 IU/L (ref 0–40)
Albumin: 4.8 g/dL (ref 3.8–4.9)
Alkaline Phosphatase: 75 IU/L (ref 44–121)
BUN/Creatinine Ratio: 15 (ref 9–20)
BUN: 16 mg/dL (ref 6–24)
Bilirubin Total: 1.6 mg/dL — ABNORMAL HIGH (ref 0.0–1.2)
CO2: 19 mmol/L — ABNORMAL LOW (ref 20–29)
Calcium: 10.1 mg/dL (ref 8.7–10.2)
Chloride: 101 mmol/L (ref 96–106)
Creatinine, Ser: 1.04 mg/dL (ref 0.76–1.27)
Globulin, Total: 2.8 g/dL (ref 1.5–4.5)
Glucose: 85 mg/dL (ref 70–99)
Potassium: 4.9 mmol/L (ref 3.5–5.2)
Sodium: 137 mmol/L (ref 134–144)
Total Protein: 7.6 g/dL (ref 6.0–8.5)
eGFR: 85 mL/min/{1.73_m2} (ref >59)

## 2024-04-17 LAB — LIPID PANEL
Chol/HDL Ratio: 3.8 ratio (ref 0.0–5.0)
Cholesterol, Total: 242 mg/dL — ABNORMAL HIGH (ref 100–199)
HDL: 63 mg/dL (ref >39)
LDL Chol Calc (NIH): 153 mg/dL — ABNORMAL HIGH (ref 0–99)
Triglycerides: 145 mg/dL (ref 0–149)
VLDL Cholesterol Cal: 26 mg/dL (ref 5–40)

## 2024-04-17 LAB — PSA TOTAL (REFLEX TO FREE): Prostate Specific Ag, Serum: 1 ng/mL (ref 0.0–4.0)

## 2024-04-17 LAB — VITAMIN D 25 HYDROXY (VIT D DEFICIENCY, FRACTURES): Vit D, 25-Hydroxy: 31.7 ng/mL (ref 30.0–100.0)

## 2024-04-20 LAB — COLOGUARD: COLOGUARD: NEGATIVE

## 2024-05-11 ENCOUNTER — Encounter: Payer: Self-pay | Admitting: Nurse Practitioner

## 2024-06-04 ENCOUNTER — Ambulatory Visit (INDEPENDENT_AMBULATORY_CARE_PROVIDER_SITE_OTHER): Admitting: Nurse Practitioner

## 2024-06-04 ENCOUNTER — Encounter: Payer: Self-pay | Admitting: Nurse Practitioner

## 2024-06-04 VITALS — BP 138/74 | HR 64 | Temp 98.3°F | Resp 16 | Ht 69.0 in | Wt 226.0 lb

## 2024-06-04 DIAGNOSIS — E782 Mixed hyperlipidemia: Secondary | ICD-10-CM

## 2024-06-04 DIAGNOSIS — I10 Essential (primary) hypertension: Secondary | ICD-10-CM

## 2024-06-04 DIAGNOSIS — R748 Abnormal levels of other serum enzymes: Secondary | ICD-10-CM | POA: Diagnosis not present

## 2024-06-04 DIAGNOSIS — Z0001 Encounter for general adult medical examination with abnormal findings: Secondary | ICD-10-CM | POA: Diagnosis not present

## 2024-06-04 NOTE — Progress Notes (Signed)
 [facility] [address] [place], [place] [address]  Internal MEDICINE  Office Visit Note  Patient Name: Terrence Yang  [id number]  [id number]  Date of Service: 06/04/2024  Chief Complaint  Patient presents with   Hypertension   Annual Exam    HPI Jaimon presents for an annual well visit and physical exam.  Well-appearing 58 y.o. male with hypertension and asymptomatic bradycardia Routine CRC screening: cologuard test negative in June, repeat in 3 years.  Labs: reviewd lab results  CBC returned to normal Elevated liver enzymes and elevated bilirubin Elevated LDL, does not eat much red meat, has increased chicken and fish in diet, is active PSA was normal Vitamin D  was normal  New or worsening pain: arthritis pain in hands and knees sometimes.  Other concerns: was taking aspirin arthritis daily, stopped that due to liver enzymes being elelvated.     Current Medication: Outpatient Encounter Medications as of 06/04/2024  Medication Sig   aspirin 81 MG chewable tablet Chew 81 mg by mouth daily.   lisinopril  (ZESTRIL ) 10 MG tablet Take 1 tablet (10 mg total) by mouth daily.   No facility-administered encounter medications on file as of 06/04/2024.    Surgical History: Past Surgical History:  Procedure Laterality Date   NO PAST SURGERIES      Medical History: Past Medical History:  Diagnosis Date   Asthma    Chicken pox    Hypertension     Family History: Family History  Problem Relation Age of Onset   Sudden death Brother    Heart disease Brother    Hypertension Brother    Heart disease Maternal Grandmother     Social History   Socioeconomic History   Marital status: Married    Spouse name: Not on file   Number of children: Not on file   Years of education: Not on file   Highest education level: Not on file  Occupational History   Not on file  Tobacco Use   Smoking status: Never   Smokeless tobacco: Never  Substance and Sexual Activity    Alcohol use: Yes   Drug use: No   Sexual activity: Yes  Other Topics Concern   Not on file  Social History Narrative   Not on file   Social Drivers of Health   Financial Resource Strain: Not on file  Food Insecurity: Not on file  Transportation Needs: Not on file  Physical Activity: Not on file  Stress: Not on file  Social Connections: Not on file  Intimate Partner Violence: Not on file      Review of Systems  Constitutional:  Negative for activity change, appetite change, chills, fatigue, fever and unexpected weight change.  HENT: Negative.  Negative for congestion, ear pain, postnasal drip, rhinorrhea, sneezing, sore throat and trouble swallowing.   Eyes: Negative.  Negative for redness.  Respiratory: Negative.  Negative for cough, chest tightness, shortness of breath and wheezing.   Cardiovascular: Negative.  Negative for chest pain and palpitations.  Gastrointestinal: Negative.  Negative for abdominal pain, blood in stool, constipation, diarrhea, nausea and vomiting.  Endocrine: Negative.   Genitourinary: Negative.  Negative for difficulty urinating, dysuria, frequency, hematuria and urgency.  Musculoskeletal: Negative.  Negative for arthralgias, back pain, joint swelling, myalgias and neck pain.  Skin: Negative.  Negative for rash and wound.  Allergic/Immunologic: Negative.  Negative for immunocompromised state.  Neurological: Negative.  Negative for dizziness, tremors, seizures, numbness and headaches.  Hematological: Negative.  Negative for adenopathy. Does  not bruise/bleed easily.  Psychiatric/Behavioral: Negative.  Negative for behavioral problems, self-injury, sleep disturbance and suicidal ideas. The patient is not nervous/anxious.     Vital Signs: BP 138/74   Pulse 64   Temp 98.3 F (36.8 C)   Resp 16   Ht 5' 9 (1.753 m)   Wt 226 lb (102.5 kg)   SpO2 96%   BMI 33.37 kg/m    Physical Exam Vitals reviewed.  Constitutional:      General: He is not in  acute distress.    Appearance: Normal appearance. He is well-developed. He is obese. He is not ill-appearing or diaphoretic.  HENT:     Head: Normocephalic and atraumatic.     Right Ear: Tympanic membrane, ear canal and external ear normal. There is no impacted cerumen.     Left Ear: Tympanic membrane, ear canal and external ear normal. There is no impacted cerumen.     Nose: Nose normal. No congestion or rhinorrhea.     Mouth/Throat:     Mouth: Mucous membranes are moist.     Pharynx: Oropharynx is clear. No oropharyngeal exudate or posterior oropharyngeal erythema.  Eyes:     General: No scleral icterus.       Right eye: No discharge.        Left eye: No discharge.     Extraocular Movements: Extraocular movements intact.     Conjunctiva/sclera: Conjunctivae normal.     Pupils: Pupils are equal, round, and reactive to light.  Neck:     Thyroid: No thyromegaly.     Vascular: No carotid bruit or JVD.     Trachea: No tracheal deviation.  Cardiovascular:     Rate and Rhythm: Normal rate and regular rhythm.     Pulses: Normal pulses.     Heart sounds: Normal heart sounds. No murmur heard.    No friction rub. No gallop.  Pulmonary:     Effort: Pulmonary effort is normal. No respiratory distress.     Breath sounds: Normal breath sounds. No stridor. No wheezing or rales.  Chest:     Chest wall: No tenderness.  Abdominal:     General: Bowel sounds are normal. There is no distension.     Palpations: Abdomen is soft. There is no mass.     Tenderness: There is no abdominal tenderness. There is no guarding or rebound.  Musculoskeletal:        General: No tenderness or deformity. Normal range of motion.     Cervical back: Normal range of motion and neck supple.  Lymphadenopathy:     Cervical: No cervical adenopathy.  Skin:    General: Skin is warm and dry.     Coloration: Skin is not pale.     Findings: No erythema or rash.  Neurological:     Mental Status: He is alert and oriented to  person, place, and time.     Cranial Nerves: No cranial nerve deficit.     Motor: No abnormal muscle tone.     Coordination: Coordination normal.     Deep Tendon Reflexes: Reflexes are normal and symmetric.  Psychiatric:        Mood and Affect: Mood normal.        Behavior: Behavior normal.        Thought Content: Thought content normal.        Judgment: Judgment normal.        Assessment/Plan: 1. Encounter for routine adult health examination with abnormal findings (Primary) Age-appropriate preventive  screenings and vaccinations discussed, annual physical exam completed. Routine labs for health maintenance ordered (if needed). PHM updated.    2. Essential hypertension Stable continue lisinopril  as prescribed   3. Elevated liver enzymes Repeat liver function labs in a few months.  - Hepatic function panel  4. Mixed hyperlipidemia Limit red meat intake, increase lean protein in diet and increased physical activity as tolerated.      General Counseling: purnell daigle understanding of the findings of todays visit and agrees with plan of treatment. I have discussed any further diagnostic evaluation that may be needed or ordered today. We also reviewed his medications today. he has been encouraged to call the office with any questions or concerns that should arise related to todays visit.    Orders Placed This Encounter  Procedures   Hepatic function panel    No orders of the defined types were placed in this encounter.   Return in about 6 months (around 12/05/2024) for F/U, Azim Gillingham PCP.   Total time spent:30 Minutes Time spent includes review of chart, medications, test results, and follow up plan with the patient.   [place] Controlled Substance Database was reviewed by me.  This patient was seen by [name], [name] in collaboration with Dr. Sigrid Bathe as a part of collaborative care agreement.  Jackee Glasner R. Maxin, MSN, [name] Internal medicine

## 2024-06-24 DIAGNOSIS — E782 Mixed hyperlipidemia: Secondary | ICD-10-CM | POA: Insufficient documentation

## 2024-06-24 DIAGNOSIS — R748 Abnormal levels of other serum enzymes: Secondary | ICD-10-CM | POA: Insufficient documentation

## 2024-06-30 ENCOUNTER — Encounter: Payer: Self-pay | Admitting: Nurse Practitioner

## 2024-07-17 LAB — HEPATIC FUNCTION PANEL
ALT: 67 IU/L — ABNORMAL HIGH (ref 0–44)
AST: 35 IU/L (ref 0–40)
Albumin: 4.7 g/dL (ref 3.8–4.9)
Alkaline Phosphatase: 68 IU/L (ref 44–121)
Bilirubin Total: 0.8 mg/dL (ref 0.0–1.2)
Bilirubin, Direct: 0.24 mg/dL (ref 0.00–0.40)
Total Protein: 7 g/dL (ref 6.0–8.5)

## 2024-08-28 ENCOUNTER — Ambulatory Visit: Payer: Self-pay | Admitting: Internal Medicine

## 2024-12-05 ENCOUNTER — Encounter: Payer: Self-pay | Admitting: Nurse Practitioner

## 2024-12-05 ENCOUNTER — Ambulatory Visit (INDEPENDENT_AMBULATORY_CARE_PROVIDER_SITE_OTHER): Admitting: Nurse Practitioner

## 2024-12-05 VITALS — BP 138/88 | HR 67 | Temp 97.4°F | Resp 16 | Ht 69.0 in | Wt 231.2 lb

## 2024-12-05 DIAGNOSIS — I10 Essential (primary) hypertension: Secondary | ICD-10-CM

## 2024-12-05 DIAGNOSIS — Z125 Encounter for screening for malignant neoplasm of prostate: Secondary | ICD-10-CM

## 2024-12-05 DIAGNOSIS — E782 Mixed hyperlipidemia: Secondary | ICD-10-CM | POA: Diagnosis not present

## 2024-12-05 DIAGNOSIS — R748 Abnormal levels of other serum enzymes: Secondary | ICD-10-CM

## 2024-12-05 NOTE — Progress Notes (Signed)
 [facility] [address] [place], [place] [address]  Internal MEDICINE  Office Visit Note  Patient Name: Terrence Yang  [id number]  [id number]  Date of Service: 12/05/2024  Chief Complaint  Patient presents with   Hypertension   Follow-up    HPI Terrence Yang presents for a follow-up visit for lab ordered, hypertension and elevated ALT.  Elevated ALT -- stopped taking tylenol and only drinks occasionally, not daily.  Hypertension -- controlled with lisinopril  Due for routine labs.     Current Medication: Outpatient Encounter Medications as of 12/05/2024  Medication Sig   aspirin 81 MG chewable tablet Chew 81 mg by mouth daily.   lisinopril  (ZESTRIL ) 10 MG tablet Take 1 tablet (10 mg total) by mouth daily.   No facility-administered encounter medications on file as of 12/05/2024.    Surgical History: Past Surgical History:  Procedure Laterality Date   NO PAST SURGERIES      Medical History: Past Medical History:  Diagnosis Date   Asthma    Chicken pox    Hypertension     Family History: Family History  Problem Relation Age of Onset   Sudden death Brother    Heart disease Brother    Hypertension Brother    Heart disease Maternal Grandmother     Social History   Socioeconomic History   Marital status: Married    Spouse name: Not on file   Number of children: Not on file   Years of education: Not on file   Highest education level: Not on file  Occupational History   Not on file  Tobacco Use   Smoking status: Never   Smokeless tobacco: Never  Substance and Sexual Activity   Alcohol use: Yes   Drug use: No   Sexual activity: Yes  Other Topics Concern   Not on file  Social History Narrative   Not on file   Social Drivers of Health   Tobacco Use: Low Risk (12/05/2024)   Patient History    Smoking Tobacco Use: Never    Smokeless Tobacco Use: Never    Passive Exposure: Not on file  Financial Resource Strain: Not on file  Food Insecurity:  Not on file  Transportation Needs: Not on file  Physical Activity: Not on file  Stress: Not on file  Social Connections: Not on file  Intimate Partner Violence: Not on file  Depression (PHQ2-9): Low Risk (06/04/2024)   Depression (PHQ2-9)    PHQ-2 Score: 0  Alcohol Screen: Low Risk (04/09/2024)   Alcohol Screen    Last Alcohol Screening Score (AUDIT): 2  Housing: Not on file  Utilities: Not on file  Health Literacy: Not on file      Review of Systems  Vital Signs: BP 138/88   Pulse 67   Temp (!) 97.4 F (36.3 C)   Resp 16   Ht 5' 9 (1.753 m)   Wt 231 lb 3.2 oz (104.9 kg)   SpO2 97%   BMI 34.14 kg/m    Physical Exam     Assessment/Plan: 1. Essential hypertension (Primary) Stable, continue lisinopril  as prescribed. Routine labs ordered.  - CBC with Differential/Platelet - CMP14+EGFR - Lipid Profile  2. Elevated liver enzymes Routine labs ordered  - CBC with Differential/Platelet - CMP14+EGFR - Lipid Profile  3. Mixed hyperlipidemia Routine labs ordered  - CBC with Differential/Platelet - CMP14+EGFR - Lipid Profile  4. Screening for prostate cancer Routine lab ordered - PSA Total (Reflex To Free)   General Counseling: Terrence Yang  understanding of the findings of todays visit and agrees with plan of treatment. I have discussed any further diagnostic evaluation that may be needed or ordered today. We also reviewed his medications today. he has been encouraged to call the office with any questions or concerns that should arise related to todays visit.    Orders Placed This Encounter  Procedures   CBC with Differential/Platelet   CMP14+EGFR   Lipid Profile   PSA Total (Reflex To Free)    No orders of the defined types were placed in this encounter.   Return for previously scheduled, CPE, Terrence Yang PCP in late july, may need to reschedule due to work.   Total time spent:30 Minutes Time spent includes review of chart, medications, test  results, and follow up plan with the patient.   [name] Controlled Substance Database was reviewed by me.  This patient was seen by Terrence Maxin, [name] in collaboration with Dr. Sigrid Yang as a part of collaborative care agreement.   Terrence Grayson R. Maxin, MSN, [name] Internal medicine

## 2025-06-05 ENCOUNTER — Encounter: Admitting: Nurse Practitioner

## 2025-06-09 ENCOUNTER — Encounter: Admitting: Nurse Practitioner
